# Patient Record
Sex: Female | Born: 1990 | Race: Black or African American | Hispanic: No | Marital: Single | Smoking: Former smoker
Health system: Southern US, Community
[De-identification: ages and names within clinical notes are randomized; demographics above are authoritative.]

---

## 2014-07-06 ENCOUNTER — Encounter (HOSPITAL_COMMUNITY): Payer: Self-pay | Admitting: Emergency Medicine

## 2014-07-06 ENCOUNTER — Inpatient Hospital Stay (HOSPITAL_COMMUNITY): Payer: 59

## 2014-07-06 ENCOUNTER — Emergency Department (INDEPENDENT_AMBULATORY_CARE_PROVIDER_SITE_OTHER)
Admission: EM | Admit: 2014-07-06 | Discharge: 2014-07-06 | Disposition: A | Discharge status: Home / Self Care | Payer: 59 | Source: Home / Self Care | Attending: Emergency Medicine | Admitting: Emergency Medicine

## 2014-07-06 ENCOUNTER — Inpatient Hospital Stay (HOSPITAL_COMMUNITY)
Admission: AD | Admit: 2014-07-06 | Discharge: 2014-07-06 | Disposition: A | Discharge status: Home / Self Care | Payer: 59 | Source: Ambulatory Visit | Attending: Obstetrics & Gynecology | Admitting: Obstetrics & Gynecology

## 2014-07-06 ENCOUNTER — Other Ambulatory Visit (HOSPITAL_COMMUNITY)
Admission: RE | Admit: 2014-07-06 | Discharge: 2014-07-06 | Disposition: A | Discharge status: Home / Self Care | Payer: 59 | Source: Ambulatory Visit | Attending: Emergency Medicine | Admitting: Emergency Medicine

## 2014-07-06 ENCOUNTER — Encounter (HOSPITAL_COMMUNITY): Payer: Self-pay | Admitting: *Deleted

## 2014-07-06 DIAGNOSIS — R102 Pelvic and perineal pain: Secondary | ICD-10-CM | POA: Insufficient documentation

## 2014-07-06 DIAGNOSIS — N760 Acute vaginitis: Secondary | ICD-10-CM | POA: Insufficient documentation

## 2014-07-06 DIAGNOSIS — Z113 Encounter for screening for infections with a predominantly sexual mode of transmission: Secondary | ICD-10-CM | POA: Diagnosis not present

## 2014-07-06 DIAGNOSIS — N83209 Unspecified ovarian cyst, unspecified side: Secondary | ICD-10-CM

## 2014-07-06 DIAGNOSIS — N8320 Unspecified ovarian cysts: Secondary | ICD-10-CM | POA: Insufficient documentation

## 2014-07-06 LAB — CERVICOVAGINAL ANCILLARY ONLY
WET PREP (BD AFFIRM): POSITIVE — AB
Wet Prep (BD Affirm): NEGATIVE
Wet Prep (BD Affirm): NEGATIVE

## 2014-07-06 LAB — CBC WITH DIFFERENTIAL/PLATELET
Basophils Absolute: 0 10*3/uL (ref 0.0–0.1)
Basophils Relative: 0 % (ref 0–1)
EOS ABS: 0 10*3/uL (ref 0.0–0.7)
Eosinophils Relative: 1 % (ref 0–5)
HCT: 37.6 % (ref 36.0–46.0)
HEMOGLOBIN: 12.1 g/dL (ref 12.0–15.0)
LYMPHS PCT: 29 % (ref 12–46)
Lymphs Abs: 1.8 10*3/uL (ref 0.7–4.0)
MCH: 24.7 pg — AB (ref 26.0–34.0)
MCHC: 32.2 g/dL (ref 30.0–36.0)
MCV: 76.7 fL — AB (ref 78.0–100.0)
MONOS PCT: 6 % (ref 3–12)
Monocytes Absolute: 0.4 10*3/uL (ref 0.1–1.0)
Neutro Abs: 4 10*3/uL (ref 1.7–7.7)
Neutrophils Relative %: 64 % (ref 43–77)
Platelets: 329 10*3/uL (ref 150–400)
RBC: 4.9 MIL/uL (ref 3.87–5.11)
RDW: 15.6 % — ABNORMAL HIGH (ref 11.5–15.5)
WBC: 6.3 10*3/uL (ref 4.0–10.5)

## 2014-07-06 LAB — POCT URINALYSIS DIP (DEVICE)
BILIRUBIN URINE: NEGATIVE
GLUCOSE, UA: NEGATIVE mg/dL
Ketones, ur: NEGATIVE mg/dL
Leukocytes, UA: NEGATIVE
Nitrite: NEGATIVE
PH: 5.5 (ref 5.0–8.0)
Protein, ur: NEGATIVE mg/dL
Specific Gravity, Urine: 1.025 (ref 1.005–1.030)
Urobilinogen, UA: 0.2 mg/dL (ref 0.0–1.0)

## 2014-07-06 LAB — POCT I-STAT, CHEM 8
BUN: 15 mg/dL (ref 6–23)
CALCIUM ION: 1.22 mmol/L (ref 1.12–1.23)
Chloride: 104 mEq/L (ref 96–112)
Creatinine, Ser: 0.8 mg/dL (ref 0.50–1.10)
Glucose, Bld: 99 mg/dL (ref 70–99)
HEMATOCRIT: 39 % (ref 36.0–46.0)
Hemoglobin: 13.3 g/dL (ref 12.0–15.0)
POTASSIUM: 4.3 meq/L (ref 3.7–5.3)
Sodium: 137 mEq/L (ref 137–147)
TCO2: 23 mmol/L (ref 0–100)

## 2014-07-06 LAB — POCT PREGNANCY, URINE: Preg Test, Ur: NEGATIVE

## 2014-07-06 MED ORDER — ONDANSETRON 4 MG PO TBDP
8.0000 mg | ORAL_TABLET | Freq: Once | ORAL | Status: AC
  Administered 2014-07-06: 8 mg via ORAL

## 2014-07-06 MED ORDER — HYDROMORPHONE HCL 1 MG/ML IJ SOLN
1.0000 mg | Freq: Once | INTRAMUSCULAR | Status: AC
  Administered 2014-07-06: 1 mg via INTRAMUSCULAR

## 2014-07-06 MED ORDER — OXYCODONE-ACETAMINOPHEN 5-325 MG PO TABS: 1.0000 | ORAL_TABLET | ORAL | Status: DC | PRN

## 2014-07-06 MED ORDER — ONDANSETRON 4 MG PO TBDP
ORAL_TABLET | ORAL | Status: AC
  Filled 2014-07-06: qty 2

## 2014-07-06 MED ORDER — HYDROMORPHONE HCL 1 MG/ML IJ SOLN
INTRAMUSCULAR | Status: AC
  Filled 2014-07-06: qty 1

## 2014-07-06 MED ORDER — IBUPROFEN 600 MG PO TABS: 600.0000 mg | ORAL_TABLET | Freq: Four times a day (QID) | ORAL | Status: DC | PRN

## 2014-07-06 NOTE — MAU Note (Signed)
Patient states she started having pain that starts from the umbilicus around to the anus. Was seen at Urgent Care and sent to MAU. Has nausea, no vomiting since the medications she was given there. Denies vaginal bleeding or discharge.

## 2014-07-06 NOTE — MAU Provider Note (Signed)
CC: Abdominal Pain   Initial contact after pt returned from [place] @1730     HPI Virginia Howard is a 23 y.o.  Nulligravida who is sent here  from Urgent Care where she was seen today for [place] to r/o ovarian torsion. Note not available but she was evaluated by Dr. Lorenz Coaster and had pelvic and WP, GC/CT cultures sent.  She has had severe lower abdominal pain since yesterday morning. The pain is crampy and feels like spasms, waxes and wanes, worse in morning. Pain resolved after Dilaudid given in Urgent Care but reluctant to take narcotics due to nausea.  Denies any irritative or abnormal vaginal discharge. No dysparunia. LMP 06/15/14. Denies abnormal bleeding. No contraception. She has been in mutually monogamous relationship for 8 years. Needs Gynecologist.   History reviewed. No pertinent past medical history.  OB History  No data available    History reviewed. No pertinent past surgical history.  History   Social History  . Marital Status: Single    Spouse Name: N/A    Number of Children: N/A  . Years of Education: N/A   Occupational History  . Not on file.   Social History Main Topics  . Smoking status: Current Every Day Smoker  . Smokeless tobacco: Never Used  . Alcohol Use: Yes     Comment: socially  . Drug Use: No  . Sexual Activity: Yes    Birth Control/ Protection: None   Other Topics Concern  . Not on file   Social History Narrative  . No narrative on file    No current facility-administered medications on file prior to encounter.   No current outpatient prescriptions on file prior to encounter.    No Known Allergies  ROS Pertinent items in HPI  PHYSICAL EXAM Filed Vitals:   07/06/14 1519  BP: 136/64  Pulse: 60  Temp: 98.2 F (36.8 C)  Resp: 18   General: Obese female in no acute distress (Had Dilaudid 2 hr PTA) Cardiovascular: Normal rate Respiratory: Normal effort Abdomen: Soft, obese, nontender throughtout Back: No CVAT Extremities: No  edema Neurologic: Alert and oriented    LAB RESULTS Results for orders placed during the hospital encounter of 07/06/14 (from the past 24 hour(s))  POCT URINALYSIS DIP (DEVICE)     Status: Abnormal   Collection Time    07/06/14 12:59 PM      Result Value Ref Range   Glucose, UA NEGATIVE  NEGATIVE mg/dL   Bilirubin Urine NEGATIVE  NEGATIVE   Ketones, ur NEGATIVE  NEGATIVE mg/dL   Specific Gravity, Urine 1.025  1.005 - 1.030   Hgb urine dipstick SMALL (*) NEGATIVE   pH 5.5  5.0 - 8.0   Protein, ur NEGATIVE  NEGATIVE mg/dL   Urobilinogen, UA 0.2  0.0 - 1.0 mg/dL   Nitrite NEGATIVE  NEGATIVE   Leukocytes, UA NEGATIVE  NEGATIVE  POCT PREGNANCY, URINE     Status: None   Collection Time    07/06/14  1:01 PM      Result Value Ref Range   Preg Test, Ur NEGATIVE  NEGATIVE  CBC WITH DIFFERENTIAL     Status: Abnormal   Collection Time    07/06/14  1:04 PM      Result Value Ref Range   WBC 6.3  4.0 - 10.5 K/uL   RBC 4.90  3.87 - 5.11 MIL/uL   Hemoglobin 12.1  12.0 - 15.0 g/dL   HCT 81.1  91.4 - 78.2 %   MCV 76.7 (*)  78.0 - 100.0 fL   MCH 24.7 (*) 26.0 - 34.0 pg   MCHC 32.2  30.0 - 36.0 g/dL   RDW 14.715.6 (*) 82.911.5 - 56.215.5 %   Platelets 329  150 - 400 K/uL   Neutrophils Relative % 64  43 - 77 %   Neutro Abs 4.0  1.7 - 7.7 K/uL   Lymphocytes Relative 29  12 - 46 %   Lymphs Abs 1.8  0.7 - 4.0 K/uL   Monocytes Relative 6  3 - 12 %   Monocytes Absolute 0.4  0.1 - 1.0 K/uL   Eosinophils Relative 1  0 - 5 %   Eosinophils Absolute 0.0  0.0 - 0.7 K/uL   Basophils Relative 0  0 - 1 %   Basophils Absolute 0.0  0.0 - 0.1 K/uL  POCT I-STAT, CHEM 8     Status: None   Collection Time    07/06/14  1:59 PM      Result Value Ref Range   Sodium 137  137 - 147 mEq/L   Potassium 4.3  3.7 - 5.3 mEq/L   Chloride 104  96 - 112 mEq/L   BUN 15  6 - 23 mg/dL   Creatinine, Ser 1.300.80  0.50 - 1.10 mg/dL   Glucose, Bld 99  70 - 99 mg/dL   Calcium, Ion 8.651.22  7.841.12 - 1.23 mmol/L   TCO2 23  0 - 100 mmol/L    Hemoglobin 13.3  12.0 - 15.0 g/dL   HCT 69.639.0  29.536.0 - 28.446.0 %    IMAGING [place] Transvaginal Non-ob  07/06/2014   CLINICAL DATA:  Acute pelvic pain x2 days  EXAM: TRANSABDOMINAL AND TRANSVAGINAL ULTRASOUND OF PELVIS  DOPPLER ULTRASOUND OF OVARIES  TECHNIQUE: Both transabdominal and transvaginal ultrasound examinations of the pelvis were performed. Transabdominal technique was performed for global imaging of the pelvis including uterus, ovaries, adnexal regions, and pelvic cul-de-sac.  It was necessary to proceed with endovaginal exam following the transabdominal exam to visualize the endometrium. Color and duplex Doppler ultrasound was utilized to evaluate blood flow to the ovaries.  COMPARISON:  None.  FINDINGS: Uterus  Measurements: 7.8 x 3.8 x 4.4 cm. No fibroids or other mass visualized.  Endometrium  Thickness: 10 mm.  No focal abnormality visualized.  Right ovary  Measurements: 6.0 x 3.8 x 4.4 cm. 4.4 x 3.4 x 3.4 cm complex/ hemorrhagic cyst.  Left ovary  Measurements: 2.6 x 2.0 x 2.2 cm. Normal appearance/no adnexal mass.  Pulsed Doppler evaluation of both ovaries demonstrates normal low-resistance arterial and venous waveforms.  Other findings  Moderate complex fluid/ hemorrhage in the pelvic cul-de-sac.  IMPRESSION: 4.4 cm right ovarian hemorrhagic cyst.  Moderate complex fluid/hemorrhage in the pelvic cul-de-sac. Assuming a negative pregnancy test, this is likely related to hemorrhagic cyst rupture.  No evidence of ovarian torsion.   Electronically Signed   By: Charline BillsSriyesh  Krishnan M.D.   On: 07/06/2014 17:28   [place] Pelvis Complete  07/06/2014   CLINICAL DATA:  Acute pelvic pain x2 days  EXAM: TRANSABDOMINAL AND TRANSVAGINAL ULTRASOUND OF PELVIS  DOPPLER ULTRASOUND OF OVARIES  TECHNIQUE: Both transabdominal and transvaginal ultrasound examinations of the pelvis were performed. Transabdominal technique was performed for global imaging of the pelvis including uterus, ovaries, adnexal regions, and pelvic  cul-de-sac.  It was necessary to proceed with endovaginal exam following the transabdominal exam to visualize the endometrium. Color and duplex Doppler ultrasound was utilized to evaluate blood flow to the ovaries.  COMPARISON:  None.  FINDINGS: Uterus  Measurements: 7.8 x 3.8 x 4.4 cm. No fibroids or other mass visualized.  Endometrium  Thickness: 10 mm.  No focal abnormality visualized.  Right ovary  Measurements: 6.0 x 3.8 x 4.4 cm. 4.4 x 3.4 x 3.4 cm complex/ hemorrhagic cyst.  Left ovary  Measurements: 2.6 x 2.0 x 2.2 cm. Normal appearance/no adnexal mass.  Pulsed Doppler evaluation of both ovaries demonstrates normal low-resistance arterial and venous waveforms.  Other findings  Moderate complex fluid/ hemorrhage in the pelvic cul-de-sac.  IMPRESSION: 4.4 cm right ovarian hemorrhagic cyst.  Moderate complex fluid/hemorrhage in the pelvic cul-de-sac. Assuming a negative pregnancy test, this is likely related to hemorrhagic cyst rupture.  No evidence of ovarian torsion.   Electronically Signed   By: Charline BillsSriyesh  Krishnan M.D.   On: 07/06/2014 17:28   [place] Art/ven Flow Abd Pelv Doppler  07/06/2014   CLINICAL DATA:  Acute pelvic pain x2 days  EXAM: TRANSABDOMINAL AND TRANSVAGINAL ULTRASOUND OF PELVIS  DOPPLER ULTRASOUND OF OVARIES  TECHNIQUE: Both transabdominal and transvaginal ultrasound examinations of the pelvis were performed. Transabdominal technique was performed for global imaging of the pelvis including uterus, ovaries, adnexal regions, and pelvic cul-de-sac.  It was necessary to proceed with endovaginal exam following the transabdominal exam to visualize the endometrium. Color and duplex Doppler ultrasound was utilized to evaluate blood flow to the ovaries.  COMPARISON:  None.  FINDINGS: Uterus  Measurements: 7.8 x 3.8 x 4.4 cm. No fibroids or other mass visualized.  Endometrium  Thickness: 10 mm.  No focal abnormality visualized.  Right ovary  Measurements: 6.0 x 3.8 x 4.4 cm. 4.4 x 3.4 x 3.4 cm complex/  hemorrhagic cyst.  Left ovary  Measurements: 2.6 x 2.0 x 2.2 cm. Normal appearance/no adnexal mass.  Pulsed Doppler evaluation of both ovaries demonstrates normal low-resistance arterial and venous waveforms.  Other findings  Moderate complex fluid/ hemorrhage in the pelvic cul-de-sac.  IMPRESSION: 4.4 cm right ovarian hemorrhagic cyst.  Moderate complex fluid/hemorrhage in the pelvic cul-de-sac. Assuming a negative pregnancy test, this is likely related to hemorrhagic cyst rupture.  No evidence of ovarian torsion.   Electronically Signed   By: Charline BillsSriyesh  Krishnan M.D.   On: 07/06/2014 17:28    MAU COURSE   ASSESSMENT  1. Acute pelvic pain, female   2. Hemorrhagic cyst of ovary     PLAN Discharge home. See AVS for patient education.    Medication List    STOP taking these medications       MUCINEX FAST-MAX COLD FLU 5-10-200-325 MG Tabs  Generic drug:  Phenylephrine-DM-GG-APAP      TAKE these medications       ibuprofen 600 MG tablet  Commonly known as:  ADVIL,MOTRIN  Take 1 tablet (600 mg total) by mouth every 6 (six) hours as needed.     MULTIVITAMIN PO  Take 1 tablet by mouth daily.     oxyCODONE-acetaminophen 5-325 MG per tablet  Commonly known as:  PERCOCET/ROXICET  Take 1 tablet by mouth every 4 (four) hours as needed.      Discussed alternating Acetaminophen (up to 4 Gm/24 hr)  and Motrin and Percocet for breakthrough pain.  Follow-up Information   Follow up with [facility]. (Someone from Clinic will call you with appt.)    Contact information:   [address] [place] [address] [phone]       Danae OrleansDeirdre C Soumya Colson, CNM 07/06/2014 4:51 PM

## 2014-07-06 NOTE — ED Provider Notes (Signed)
Chief Complaint   Abdominal Pain   History of Present Illness   Serena ColonelJasmine Sargent is a 23 year old female who experienced sudden onset of pain yesterday morning at 11 AM. This occurred shortly after she woke up and she did not have any other precipitating causes. Specifically was not brought on by eating. The pain was localized around the umbilicus and radiates through to the anus. She had 2 hours of very severe pain rated 12/10 in intensity, then it diminished to a 3-4/10 in intensity after a meal. The pain persisted throughout the remainder the day. The same thing happened this morning after she woke up with severe pain for several hours, diminishing to a lesser pain throughout the rest of the day. She noted muscle spasms, cramps, vaginal pain, and pelvic pain. She denies any fever or chills. When the pain was severe she felt hot and sweaty. She denies any nausea or vomiting. Her appetite was good and. Her bowel movements have been regular, although this morning she felt constipated. She has not had a bowel movement today. She denies any hematochezia or melena. No urinary symptoms. No GYN complaints, vaginal discharge, or itching. Last menstrual period was October 3. She is sexually active without use of birth control. She does not think she is pregnant. She smokes one fourth pack of cigarettes per day. She's on a special very low-calorie [employer] diet. She denies any history of ulcer disease, gastritis, gallbladder or liver problems, pancreatitis, diverticulitis, appendicitis, or GYN problems such as fibroid tumors or ovarian cysts. She's had no double surgeries in the past.  Review of Systems   Other than as noted above, the patient denies any of the following symptoms: Constitutional:  No fever, chills, weight loss or anorexia. Abdomen:  No nausea, vomiting, hematememesis, melena, diarrhea, or hematochezia. GU:  No dysuria, frequency, urgency, or hematuria. Gyn:  No vaginal discharge, itching,  abnormal bleeding, dyspareunia, or pelvic pain.  PMFSH   Past medical history, family history, social history, meds, and allergies were reviewed.   Physical Exam     Vital signs:  BP 127/73  Pulse 66  Temp(Src) 98.9 F (37.2 C) (Oral)  Resp 18  SpO2 99%  LMP 06/15/2014 Gen:  Alert, oriented, in no distress. Lungs:  Breath sounds clear and equal bilaterally.  No wheezes, rales or rhonchi. Heart:  Regular rhythm.  No gallops or murmers.   Abdomen:  Abdomen is soft, flat, nondistended. No organomegaly or mass. Bowel sounds are normally active. There is pain to palpation around the umbilicus and the lower abdomen bilaterally without guarding or rebound. Pelvic:  Normal external genitalia, vaginal and cervical mucosa were normal. There was pain on cervical motion. Uterus was normal in size and shape but was tender to palpation. Adnexa are moderately tender to palpation with no masses.  DNA probes for gonorrhea, Chlamydia, Trichomonas, Gardnerella, and Candida were obtained. Skin:  Clear, warm and dry.  No rash.  Labs   Results for orders placed during the hospital encounter of 07/06/14  CBC WITH DIFFERENTIAL      Result Value Ref Range   WBC 6.3  4.0 - 10.5 K/uL   RBC 4.90  3.87 - 5.11 MIL/uL   Hemoglobin 12.1  12.0 - 15.0 g/dL   HCT 40.937.6  81.136.0 - 91.446.0 %   MCV 76.7 (*) 78.0 - 100.0 fL   MCH 24.7 (*) 26.0 - 34.0 pg   MCHC 32.2  30.0 - 36.0 g/dL   RDW 78.215.6 (*) 95.611.5 - 21.315.5 %  Platelets 329  150 - 400 K/uL   Neutrophils Relative % 64  43 - 77 %   Neutro Abs 4.0  1.7 - 7.7 K/uL   Lymphocytes Relative 29  12 - 46 %   Lymphs Abs 1.8  0.7 - 4.0 K/uL   Monocytes Relative 6  3 - 12 %   Monocytes Absolute 0.4  0.1 - 1.0 K/uL   Eosinophils Relative 1  0 - 5 %   Eosinophils Absolute 0.0  0.0 - 0.7 K/uL   Basophils Relative 0  0 - 1 %   Basophils Absolute 0.0  0.0 - 0.1 K/uL  POCT URINALYSIS DIP (DEVICE)      Result Value Ref Range   Glucose, UA NEGATIVE  NEGATIVE mg/dL   Bilirubin  Urine NEGATIVE  NEGATIVE   Ketones, ur NEGATIVE  NEGATIVE mg/dL   Specific Gravity, Urine 1.025  1.005 - 1.030   Hgb urine dipstick SMALL (*) NEGATIVE   pH 5.5  5.0 - 8.0   Protein, ur NEGATIVE  NEGATIVE mg/dL   Urobilinogen, UA 0.2  0.0 - 1.0 mg/dL   Nitrite NEGATIVE  NEGATIVE   Leukocytes, UA NEGATIVE  NEGATIVE  POCT PREGNANCY, URINE      Result Value Ref Range   Preg Test, Ur NEGATIVE  NEGATIVE  POCT I-STAT, CHEM 8      Result Value Ref Range   Sodium 137  137 - 147 mEq/L   Potassium 4.3  3.7 - 5.3 mEq/L   Chloride 104  96 - 112 mEq/L   BUN 15  6 - 23 mg/dL   Creatinine, Ser 8.180.80  0.50 - 1.10 mg/dL   Glucose, Bld 99  70 - 99 mg/dL   Calcium, Ion 2.991.22  3.711.12 - 1.23 mmol/L   TCO2 23  0 - 100 mmol/L   Hemoglobin 13.3  12.0 - 15.0 g/dL   HCT 69.639.0  78.936.0 - 38.146.0 %    Course in Urgent Care Center   The following medications were given:  Medications  HYDROmorphone (DILAUDID) injection 1 mg (1 mg Intramuscular Given 07/06/14 1337)  ondansetron (ZOFRAN-ODT) disintegrating tablet 8 mg (8 mg Oral Given 07/06/14 1340)    She got relief of pain after the above medicines.  Assessment   The encounter diagnosis was Pelvic pain in female.  With the normal white blood cell,, this makes appendicitis or diverticulitis less likely. My concerns right now or for PID versus ovarian cyst or torsion of ovary. I believe she needs further evaluation including pelvic ultrasound. Therefore [facility]. She was told not to eat or drink on her way over there. Report was called to the provider on call at the MAU.  Plan     The patient was transferred to the ED via private vehicle in stable condition. Her husband will drive her there. She was told not to eat or drink anything on her way over.  Medical Decision Making:  A 23 year old female has had a two-day history of severe, bilateral pelvic pain. She denies any associated fever, nausea, vomiting, diarrhea, or  urinary, or GYN symptoms. Her last menstrual period was October 3. On exam she is tender over her entire pelvic area, and on pelvic exam she has positive cervical motion tenderness, uterine tenderness, and bilateral adnexal tenderness. My concern is for ovarian torsion, and I feel she needs an ultrasound.      Reuben Likesavid C Audry Kauzlarich, MD 07/06/14 [phone]

## 2014-07-06 NOTE — ED Notes (Signed)
Patient c/o severe abdominal pain onset yesterday. Patient denies urinary symptoms, discharge, abnormal bleeding or constipation. 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 Denies abdominal cramping. Patient is in NAD.

## 2014-07-06 NOTE — Discharge Instructions (Signed)
Ovarian Cyst An ovarian cyst is a fluid-filled sac that forms on an ovary. The ovaries are small organs that produce eggs in women. Various types of cysts can form on the ovaries. Most are not cancerous. Many do not cause problems, and they often go away on their own. Some may cause symptoms and require treatment. Common types of ovarian cysts include:  Functional cysts--These cysts may occur every month during the menstrual cycle. This is normal. The cysts usually go away with the next menstrual cycle if the woman does not get pregnant. Usually, there are no symptoms with a functional cyst.  Endometrioma cysts--These cysts form from the tissue that lines the uterus. They are also called "chocolate cysts" because they become filled with blood that turns brown. This type of cyst can cause pain in the lower abdomen during intercourse and with your menstrual period.  Cystadenoma cysts--This type develops from the cells on the outside of the ovary. These cysts can get very big and cause lower abdomen pain and pain with intercourse. This type of cyst can twist on itself, cut off its blood supply, and cause severe pain. It can also easily rupture and cause a lot of pain.  Dermoid cysts--This type of cyst is sometimes found in both ovaries. These cysts may contain different kinds of body tissue, such as skin, teeth, hair, or cartilage. They usually do not cause symptoms unless they get very big.  Theca lutein cysts--These cysts occur when too much of a certain hormone (human chorionic gonadotropin) is produced and overstimulates the ovaries to produce an egg. This is most common after procedures used to assist with the conception of a baby (in vitro fertilization). CAUSES   Fertility drugs can cause a condition in which multiple large cysts are formed on the ovaries. This is called ovarian hyperstimulation syndrome.  A condition called polycystic ovary syndrome can cause hormonal imbalances that can lead to  nonfunctional ovarian cysts. SIGNS AND SYMPTOMS  Many ovarian cysts do not cause symptoms. If symptoms are present, they may include:  Pelvic pain or pressure.  Pain in the lower abdomen.  Pain during sexual intercourse.  Increasing girth (swelling) of the abdomen.  Abnormal menstrual periods.  Increasing pain with menstrual periods.  Stopping having menstrual periods without being pregnant. DIAGNOSIS  These cysts are commonly found during a routine or annual pelvic exam. Tests may be ordered to find out more about the cyst. These tests may include:  Ultrasound.  X-ray of the pelvis.  CT scan.  MRI.  Blood tests. TREATMENT  Many ovarian cysts go away on their own without treatment. Your health care provider may want to check your cyst regularly for 2-3 months to see if it changes. For women in menopause, it is particularly important to monitor a cyst closely because of the higher rate of ovarian cancer in menopausal women. When treatment is needed, it may include any of the following:  A procedure to drain the cyst (aspiration). This may be done using a long needle and ultrasound. It can also be done through a laparoscopic procedure. This involves using a thin, lighted tube with a tiny camera on the end (laparoscope) inserted through a small incision.  Surgery to remove the whole cyst. This may be done using laparoscopic surgery or an open surgery involving a larger incision in the lower abdomen.  Hormone treatment or birth control pills. These methods are sometimes used to help dissolve a cyst. HOME CARE INSTRUCTIONS   Only take over-the-counter   or prescription medicines as directed by your health care provider.  Follow up with your health care provider as directed.  Get regular pelvic exams and Pap tests. SEEK MEDICAL CARE IF:   Your periods are late, irregular, or painful, or they stop.  Your pelvic pain or abdominal pain does not go away.  Your abdomen becomes  larger or swollen.  You have pressure on your bladder or trouble emptying your bladder completely.  You have pain during sexual intercourse.  You have feelings of fullness, pressure, or discomfort in your stomach.  You lose weight for no apparent reason.  You feel generally ill.  You become constipated.  You lose your appetite.  You develop acne.  You have an increase in body and facial hair.  You are gaining weight, without changing your exercise and eating habits.  You think you are pregnant. SEEK IMMEDIATE MEDICAL CARE IF:   You have increasing abdominal pain.  You feel sick to your stomach (nauseous), and you throw up (vomit).  You develop a fever that comes on suddenly.  You have abdominal pain during a bowel movement.  Your menstrual periods become heavier than usual. MAKE SURE YOU:  Understand these instructions.  Will watch your condition.  Will get help right away if you are not doing well or get worse. Document Released: 09/01/2005 Document Revised: 09/06/2013 Document Reviewed: 05/09/2013 [employer] Patient Information 2015 [employer]. This information is not intended to replace advice given to you by your health care provider. Make sure you discuss any questions you have with your health care provider.  

## 2014-07-06 NOTE — Discharge Instructions (Signed)
Ovarian Torsion The ovaries are female reproductive organs that produce eggs. Ovarian torsion is when an ovary becomes twisted and cuts off its own blood supply. This can occur at any age. If an ovary is twisted, it cannot get blood and the ovary swells. It is a painful medical emergency. It must be treated quickly. If too much time has passed, blood flow to the ovary may not be restored and the ovary may have to be removed. CAUSES Torsion can happen in an ovary that is normal size. However, most of the time it occurs in an ovary that is enlarged. An ovary can become enlarged because of:  Harmless (benign) tumors on the ovaries.  Cancerous tumors.  Ovarian cysts, which are fluid-filled sacs.  Normal pregnancy.  A pregnancy that occurs outside the uterus (ectopic pregnancy). RISK FACTORS Risk factors are things that increase the likelihood of this condition happening. The risk factors include:  Having fallopian tubes that are longer than normal.  Having ovaries that are larger than normal.  Taking fertility medicine to become pregnant.  Having had surgery in the pelvic area. SYMPTOMS  Sudden pain in the lower abdomen, usually on one side only.  Pelvic pain that starts after exercise.  Pelvic pain that gets worse over time.  Severe pelvic pain that comes and goes.  Pelvic pain that spreads into the lower back or thigh.  Nausea and vomiting along with pelvic pain. DIAGNOSIS Your caregiver will take a history and perform a physical exam. He or she may be able to feel an enlarged ovary. Your caregiver may order some further tests, which include:  A pregnancy test.  Imaging tests, such as pelvic Doppler ultrasonography, that measures blood flow, CT scan, or MRI. Your caregiver may also perform a diagnostic laparoscopic exam. A small surgical cut (incision) will be made in your abdomen. Then, a small lighted telescope is put through the opening. This allows your caregiver to  clearly see your ovary and fallopian tube.  TREATMENT Surgery is needed when an ovary becomes twisted. It is best to do this 8 hours or less after the ovary becomes twisted. Laparoscopic ovarian torsion surgery is done to try to untwist the ovary. Sometimes, a large incision has to be made in the abdomen (laparotomy) to relieve the ovary. If the ovary cannot be untwisted, the ovary will have to be surgically removed during a procedure called salpingo-oophorectomy. Document Released: 08/21/2011 Document Revised: 01/16/2014 Document Reviewed: 08/21/2011 [facility] Patient Information 2015 [place], [place]. This information is not intended to replace advice given to you by your health care provider. Make sure you discuss any questions you have with your health care provider.  .Pelvic Inflammatory Disease Pelvic inflammatory disease (PID) refers to an infection in some or all of the female organs. The infection can be in the uterus, ovaries, fallopian tubes, or the surrounding tissues in the pelvis. PID can cause abdominal or pelvic pain that comes on suddenly (acute pelvic pain). PID is a serious infection because it can lead to lasting (chronic) pelvic pain or the inability to have children (infertile).  CAUSES  The infection is often caused by the normal bacteria found in the vaginal tissues. PID may also be caused by an infection that is spread during sexual contact. PID can also occur following:   The birth of a baby.   A miscarriage.   An abortion.   Major pelvic surgery.   The use of an intrauterine device (IUD).   A sexual assault.  RISK  FACTORS Certain factors can put a person at higher risk for PID, such as:  Being younger than 25 years.  Being sexually active at [place] age.  Usingnonbarrier contraception.  Havingmultiple sexual partners.  Having sex with someone who has symptoms of a genital infection.  Using oral contraception. Other times, certain behaviors can  increase the possibility of getting PID, such as:  Having sex during your period.  Using a vaginal douche.  Having an intrauterine device (IUD) in place. SYMPTOMS   Abdominal or pelvic pain.   Fever.   Chills.   Abnormal vaginal discharge.  Abnormal uterine bleeding.   Unusual pain shortly after finishing your period. DIAGNOSIS  Your caregiver will choose some of the following methods to make a diagnosis, such as:   Performinga physical exam and history. A pelvic exam typically reveals a very tender uterus and surrounding pelvis.   Ordering laboratory tests including a pregnancy test, blood tests, and urine test.  Orderingcultures of the vagina and cervix to check for a sexually transmitted infection (STI).  Performing an ultrasound.   Performing a laparoscopic procedure to look inside the pelvis.  TREATMENT   Antibiotic medicines may be prescribed and taken by mouth.   Sexual partners may be treated when the infection is caused by a sexually transmitted disease (STD).   Hospitalization may be needed to give antibiotics intravenously.  Surgery may be needed, but this is rare. It may take weeks until you are completely well. If you are diagnosed with PID, you should also be checked for human immunodeficiency virus (HIV). HOME CARE INSTRUCTIONS   If given, take your antibiotics as directed. Finish the medicine even if you start to feel better.   Only take over-the-counter or prescription medicines for pain, discomfort, or fever as directed by your caregiver.   Do not have sexual intercourse until treatment is completed or as directed by your caregiver. If PID is confirmed, your recent sexual partner(s) will need treatment.   Keep your follow-up appointments. SEEK MEDICAL CARE IF:   You have increased or abnormal vaginal discharge.   You need prescription medicine for your pain.   You vomit.   You cannot take your medicines.   Your  partner has an STD.  SEEK IMMEDIATE MEDICAL CARE IF:   You have a fever.   You have increased abdominal or pelvic pain.   You have chills.   You have pain when you urinate.   You are not better after 72 hours following treatment.  MAKE SURE YOU:   Understand these instructions.  Will watch your condition.  Will get help right away if you are not doing well or get worse. Document Released: 09/01/2005 Document Revised: 12/27/2012 Document Reviewed: 08/28/2011 [facility] Patient Information 2015 [place], [place]. This information is not intended to replace advice given to you by your health care provider. Make sure you discuss any questions you have with your health care provider.

## 2014-07-07 ENCOUNTER — Encounter: Payer: Self-pay | Admitting: Obstetrics & Gynecology

## 2014-07-07 LAB — CERVICOVAGINAL ANCILLARY ONLY
Chlamydia: NEGATIVE
Neisseria Gonorrhea: NEGATIVE

## 2014-07-10 NOTE — MAU Provider Note (Signed)
Attestation of Attending Supervision of Advanced Practitioner (CNM/NP): Evaluation and management procedures were performed by the Advanced Practitioner under my supervision and collaboration.  I have reviewed the Advanced Practitioner's note and chart, and I agree with the management and plan.  Ladrea Holladay 07/10/2014 1:15 PM

## 2014-07-11 ENCOUNTER — Telehealth (HOSPITAL_COMMUNITY): Payer: Self-pay | Admitting: Emergency Medicine

## 2014-07-11 MED ORDER — METRONIDAZOLE 500 MG PO TABS: 500.0000 mg | ORAL_TABLET | Freq: Two times a day (BID) | ORAL | Status: DC

## 2014-07-11 NOTE — ED Notes (Signed)
GC/Chlamydia neg., Affirm: Candida and Trich neg., Gardnerella pos.  Message to Dr. Lorenz CoasterKeller. Virginia Howard, Virginia Howard 07/11/2014

## 2014-07-11 NOTE — ED Notes (Signed)
The patient's DNA probe came back positive for Gardnerella. She will need metronidazole 500 mg, #14, 1 twice a day for one week. This will be sent to her pharmacy. We will need to call her and let her know these results.   Reuben Likesavid C Musa Rewerts, MD 07/11/14 [phone]

## 2014-07-12 ENCOUNTER — Telehealth (HOSPITAL_COMMUNITY): Payer: Self-pay | Admitting: *Deleted

## 2014-07-12 NOTE — ED Notes (Signed)
Dr. Lorenz CoasterKeller e-prescribed Flagyl to pt.'s pharmacy on 10/27.  I called pt. and left a message to call.  Call 1.  Pt. called back a few minutes later.  Pt. verified x 2 and given results. Pt. told she needs Flagyl for bacterial vaginosis and where to pick up her Rx.   Pt. instructed to no alcohol while taking this medication.  Pt. voiced understanding. Vassie MoselleYork, Annaka Cleaver M 07/12/2014

## 2014-08-23 ENCOUNTER — Encounter: Payer: 59 | Admitting: Obstetrics & Gynecology

## 2014-10-06 ENCOUNTER — Encounter: Payer: 59 | Admitting: Obstetrics & Gynecology

## 2014-10-12 ENCOUNTER — Encounter: Payer: Self-pay | Admitting: Obstetrics & Gynecology

## 2016-02-06 IMAGING — US US TRANSVAGINAL NON-OB
1 series · 13 of 25 positions shown · non-contrast
Comparison: None.

CLINICAL DATA: Acute pelvic pain x2 days

EXAM:
TRANSABDOMINAL AND TRANSVAGINAL ULTRASOUND OF PELVIS
DOPPLER ULTRASOUND OF OVARIES
TECHNIQUE: Both transabdominal and transvaginal ultrasound examinations of the
pelvis were performed. Transabdominal technique was performed for
global imaging of the pelvis including uterus, ovaries, adnexal
regions, and pelvic cul-de-sac.
It was necessary to proceed with endovaginal exam following the
transabdominal exam to visualize the endometrium. Color and duplex
Doppler ultrasound was utilized to evaluate blood flow to the
ovaries.

[Series 1: us pelvis complete · 66 acquisitions, 13 frames shown]
[im 1/66]
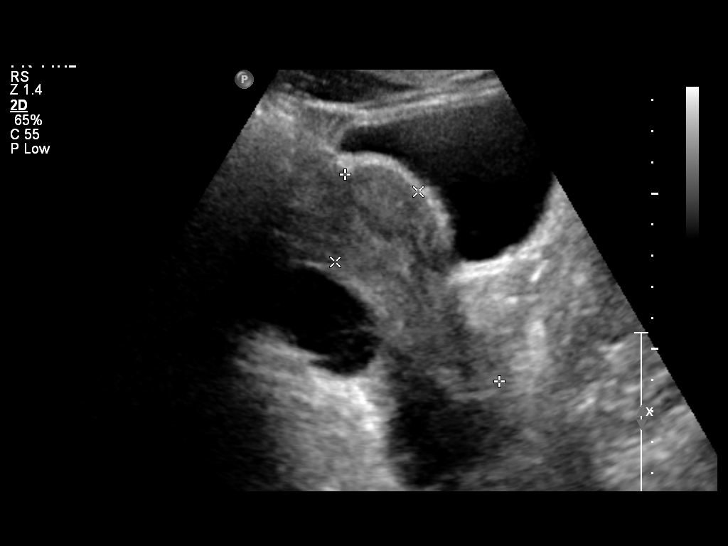
[im 6/66]
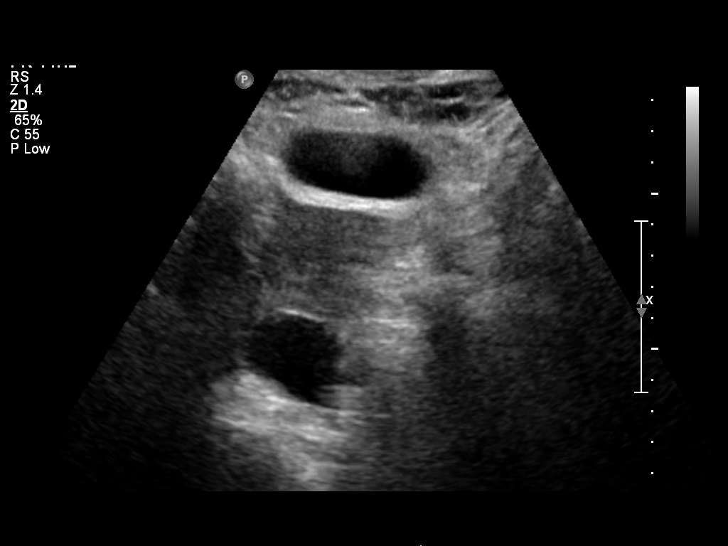
[im 11/66]
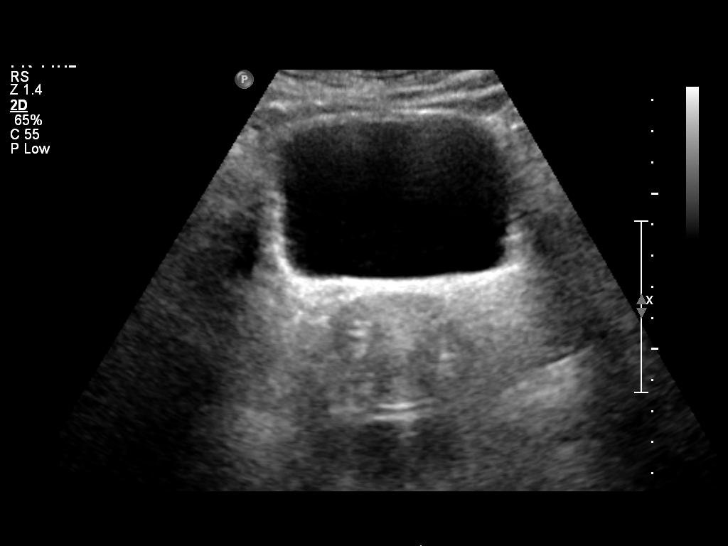
[im 17/66]
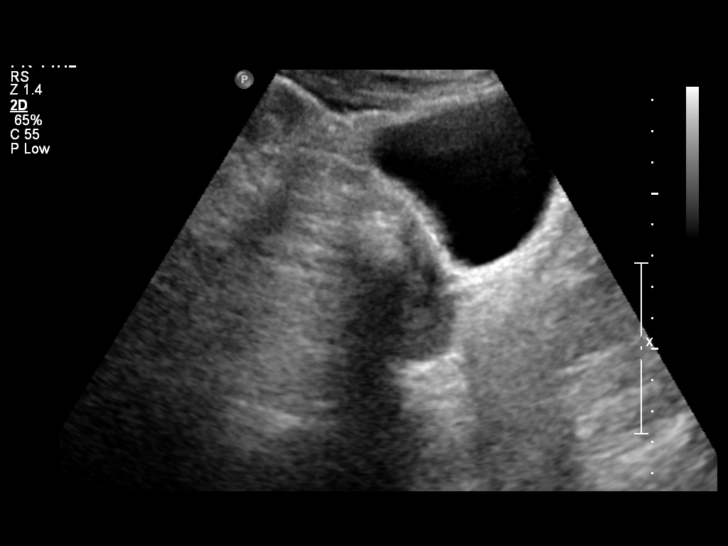
[im 22/66]
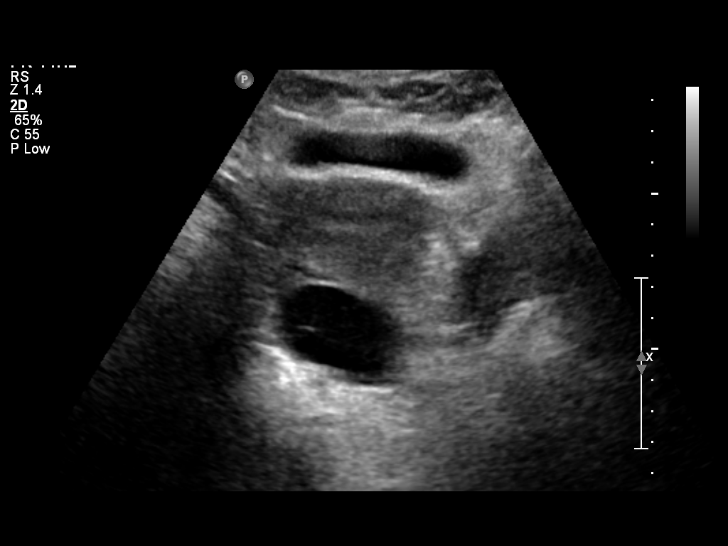
[im 28/66]
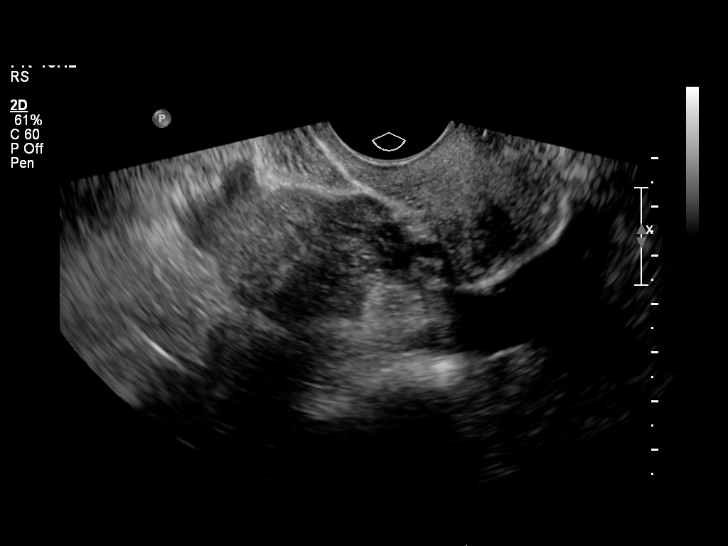
[im 33/66]
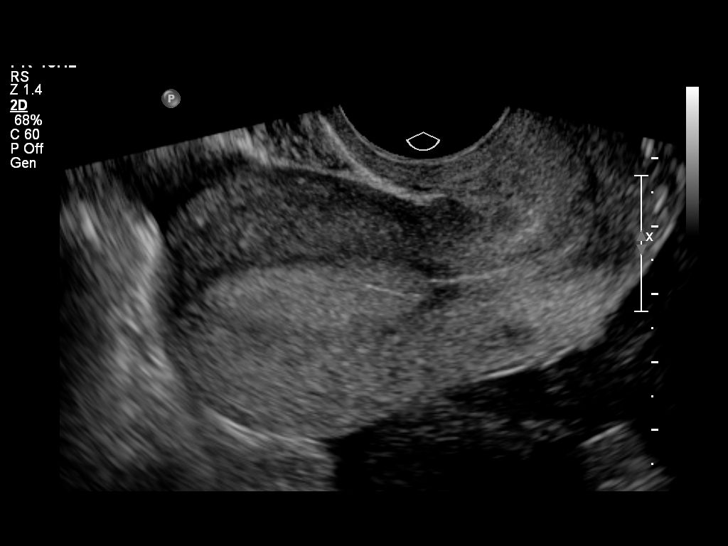
[im 38/66]
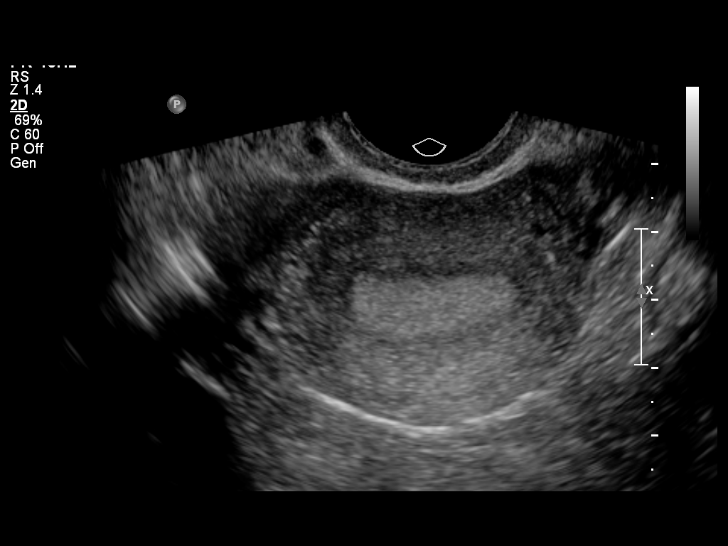
[im 44/66]
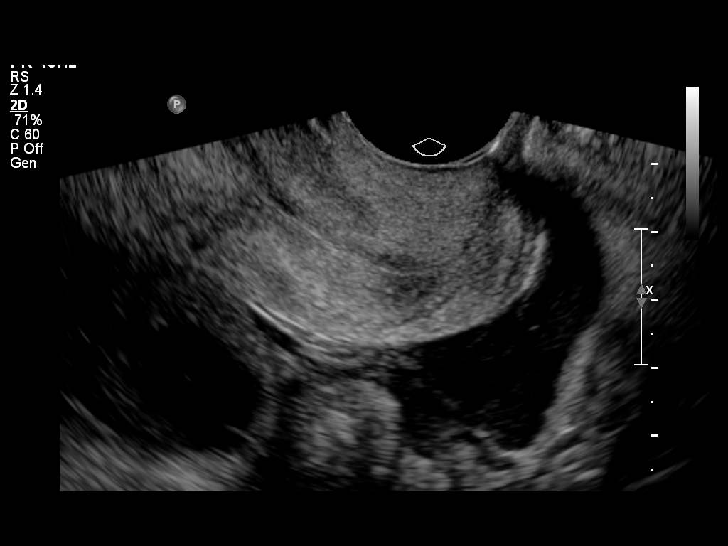
[im 49/66]
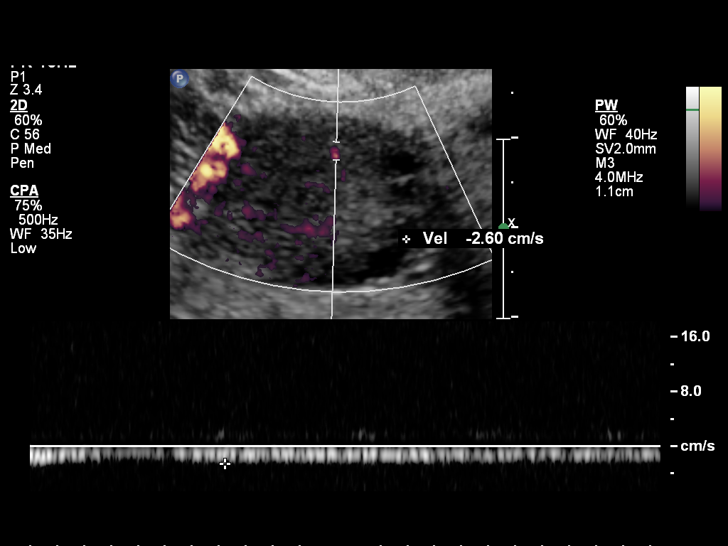
[im 55/66]
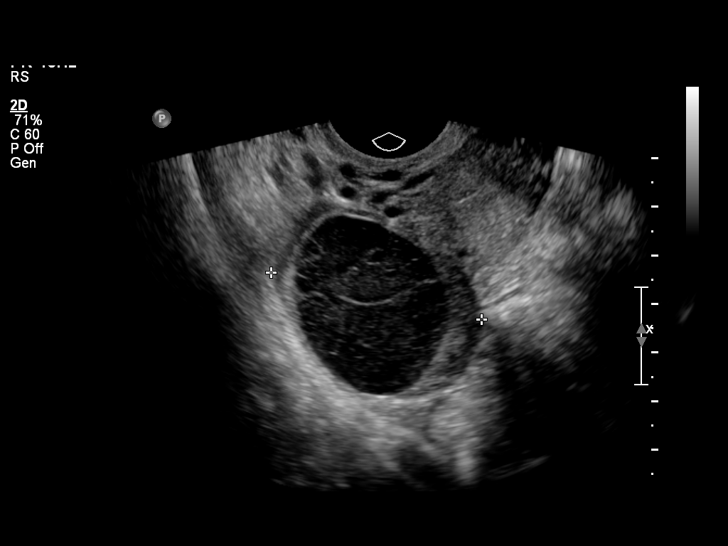
[im 60/66]
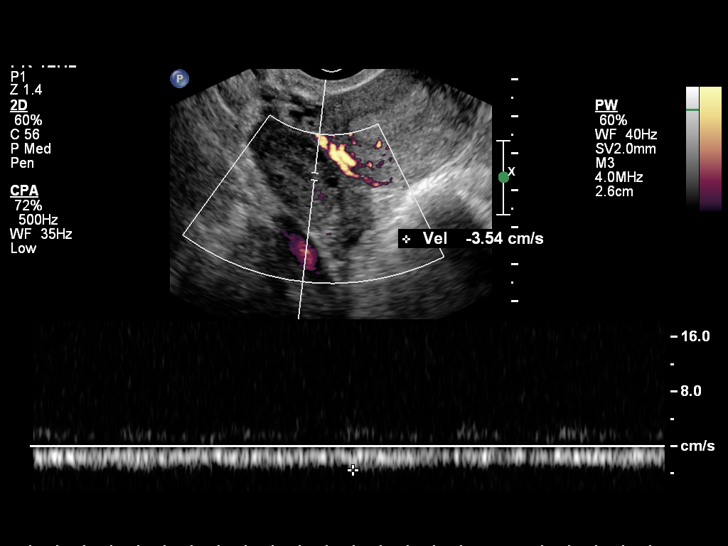
[im 66/66]
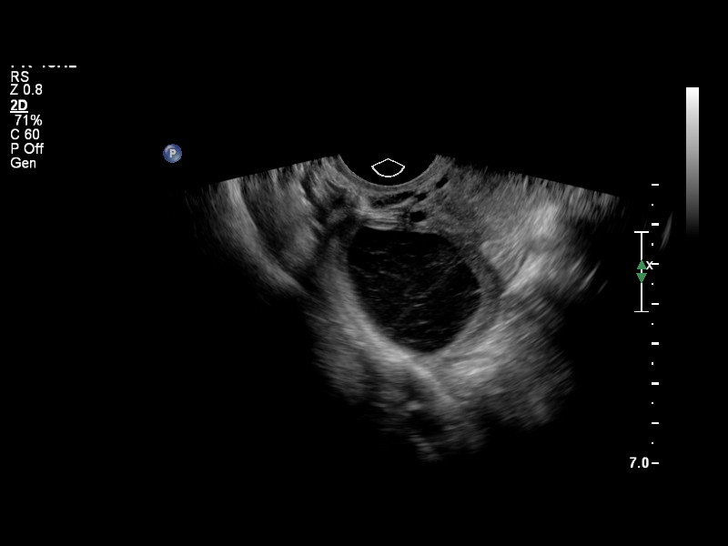

[13 of 25 positions shown; findings below may reference images not displayed]

FINDINGS: Uterus

Measurements: 7.8 x 3.8 x 4.4 cm. No fibroids or other mass
visualized.

Endometrium

Thickness: 10 mm.  No focal abnormality visualized.

Right ovary

Measurements: 6.0 x 3.8 x 4.4 cm. 4.4 x 3.4 x 3.4 cm complex/
hemorrhagic cyst.

Left ovary

Measurements: 2.6 x 2.0 x 2.2 cm. Normal appearance/no adnexal mass.

Pulsed Doppler evaluation of both ovaries demonstrates normal
low-resistance arterial and venous waveforms.

Other findings

Moderate complex fluid/ hemorrhage in the pelvic cul-de-sac.
IMPRESSION: 4.4 cm right ovarian hemorrhagic cyst.

Moderate complex fluid/hemorrhage in the pelvic cul-de-sac. Assuming
a negative pregnancy test, this is likely related to hemorrhagic
cyst rupture.

No evidence of ovarian torsion.

## 2017-01-06 ENCOUNTER — Other Ambulatory Visit: Payer: Self-pay | Admitting: Obstetrics and Gynecology

## 2017-01-06 ENCOUNTER — Other Ambulatory Visit (HOSPITAL_COMMUNITY)
Admission: RE | Admit: 2017-01-06 | Discharge: 2017-01-06 | Disposition: A | Discharge status: Home / Self Care | Payer: 59 | Source: Ambulatory Visit | Attending: Obstetrics and Gynecology | Admitting: Obstetrics and Gynecology

## 2017-01-06 DIAGNOSIS — Z113 Encounter for screening for infections with a predominantly sexual mode of transmission: Secondary | ICD-10-CM | POA: Insufficient documentation

## 2017-01-06 DIAGNOSIS — Z01419 Encounter for gynecological examination (general) (routine) without abnormal findings: Secondary | ICD-10-CM | POA: Diagnosis not present

## 2017-01-07 LAB — CYTOLOGY - PAP
Chlamydia: NEGATIVE
Diagnosis: NEGATIVE
Neisseria Gonorrhea: NEGATIVE

## 2017-09-15 NOTE — L&D Delivery Note (Signed)
Delivery Note At 3:16 PM a viable female was delivered via Vaginal, Spontaneous (Presentation: occiput anterior;  ).  APGAR: 8, 9; weight  Pending .   Placenta status: intact  , 3 vessel  Cord:  with the following complications: nucha cord x 1 reduced .  Cord pH: NA  Anesthesia: epidrual  Episiotomy: None Lacerations:  2nd degree Suture Repair: 3.0 Est. Blood Loss (mL):  100 mL  Mom to postpartum.  Baby to Couplet care / Skin to Skin.  Laymond Postle J. 11/03/2017, 3:52 PM

## 2017-11-03 ENCOUNTER — Inpatient Hospital Stay (HOSPITAL_COMMUNITY): Payer: 59 | Admitting: Anesthesiology

## 2017-11-03 ENCOUNTER — Encounter (HOSPITAL_COMMUNITY): Payer: Self-pay

## 2017-11-03 ENCOUNTER — Inpatient Hospital Stay (HOSPITAL_COMMUNITY)
Admission: AD | Admit: 2017-11-03 | Discharge: 2017-11-05 | DRG: 807 | Disposition: A | Discharge status: Home / Self Care | Payer: 59 | Source: Ambulatory Visit | Attending: Obstetrics and Gynecology | Admitting: Obstetrics and Gynecology

## 2017-11-03 DIAGNOSIS — Z87891 Personal history of nicotine dependence: Secondary | ICD-10-CM | POA: Diagnosis not present

## 2017-11-03 DIAGNOSIS — F419 Anxiety disorder, unspecified: Secondary | ICD-10-CM | POA: Diagnosis present

## 2017-11-03 DIAGNOSIS — Z3A38 38 weeks gestation of pregnancy: Secondary | ICD-10-CM

## 2017-11-03 DIAGNOSIS — F329 Major depressive disorder, single episode, unspecified: Secondary | ICD-10-CM | POA: Diagnosis present

## 2017-11-03 DIAGNOSIS — O99824 Streptococcus B carrier state complicating childbirth: Secondary | ICD-10-CM | POA: Diagnosis present

## 2017-11-03 DIAGNOSIS — O99214 Obesity complicating childbirth: Secondary | ICD-10-CM | POA: Diagnosis present

## 2017-11-03 DIAGNOSIS — O99344 Other mental disorders complicating childbirth: Secondary | ICD-10-CM | POA: Diagnosis present

## 2017-11-03 DIAGNOSIS — Z3483 Encounter for supervision of other normal pregnancy, third trimester: Secondary | ICD-10-CM | POA: Diagnosis present

## 2017-11-03 LAB — CBC
HCT: 34.3 % — ABNORMAL LOW (ref 36.0–46.0)
Hemoglobin: 11.2 g/dL — ABNORMAL LOW (ref 12.0–15.0)
MCH: 25.8 pg — AB (ref 26.0–34.0)
MCHC: 32.7 g/dL (ref 30.0–36.0)
MCV: 79 fL (ref 78.0–100.0)
PLATELETS: 247 10*3/uL (ref 150–400)
RBC: 4.34 MIL/uL (ref 3.87–5.11)
RDW: 14.4 % (ref 11.5–15.5)
WBC: 8.1 10*3/uL (ref 4.0–10.5)

## 2017-11-03 LAB — OB RESULTS CONSOLE HEPATITIS B SURFACE ANTIGEN: Hepatitis B Surface Ag: NEGATIVE

## 2017-11-03 LAB — OB RESULTS CONSOLE GBS: GBS: POSITIVE

## 2017-11-03 LAB — OB RESULTS CONSOLE GC/CHLAMYDIA
Chlamydia: NEGATIVE
Gonorrhea: NEGATIVE

## 2017-11-03 LAB — ABO/RH: ABO/RH(D): B POS

## 2017-11-03 LAB — TYPE AND SCREEN
ABO/RH(D): B POS
Antibody Screen: NEGATIVE

## 2017-11-03 LAB — OB RESULTS CONSOLE ABO/RH: RH TYPE: POSITIVE

## 2017-11-03 LAB — OB RESULTS CONSOLE HIV ANTIBODY (ROUTINE TESTING): HIV: NONREACTIVE

## 2017-11-03 LAB — OB RESULTS CONSOLE ANTIBODY SCREEN: ANTIBODY SCREEN: NEGATIVE

## 2017-11-03 LAB — OB RESULTS CONSOLE RPR: RPR: NONREACTIVE

## 2017-11-03 LAB — OB RESULTS CONSOLE RUBELLA ANTIBODY, IGM: Rubella: IMMUNE

## 2017-11-03 MED ORDER — PHENYLEPHRINE 40 MCG/ML (10ML) SYRINGE FOR IV PUSH (FOR BLOOD PRESSURE SUPPORT)
80.0000 ug | PREFILLED_SYRINGE | INTRAVENOUS | Status: DC | PRN
  Filled 2017-11-03: qty 10
  Filled 2017-11-03: qty 5

## 2017-11-03 MED ORDER — ACETAMINOPHEN 325 MG PO TABS
650.0000 mg | ORAL_TABLET | ORAL | Status: DC | PRN
  Administered 2017-11-04 (×3): 650 mg via ORAL
  Filled 2017-11-03 (×2): qty 2

## 2017-11-03 MED ORDER — LACTATED RINGERS IV SOLN
INTRAVENOUS | Status: DC
  Administered 2017-11-03: 12:00:00 via INTRAVENOUS

## 2017-11-03 MED ORDER — LIDOCAINE HCL (PF) 1 % IJ SOLN
INTRAMUSCULAR | Status: DC | PRN
  Administered 2017-11-03 (×2): 6 mL via EPIDURAL

## 2017-11-03 MED ORDER — EPHEDRINE 5 MG/ML INJ
10.0000 mg | INTRAVENOUS | Status: DC | PRN
Start: 2017-11-03 — End: 2017-11-04
  Filled 2017-11-03: qty 2

## 2017-11-03 MED ORDER — SENNOSIDES-DOCUSATE SODIUM 8.6-50 MG PO TABS
2.0000 | ORAL_TABLET | ORAL | Status: DC
  Administered 2017-11-04 (×2): 2 via ORAL
  Filled 2017-11-03 (×2): qty 2

## 2017-11-03 MED ORDER — ONDANSETRON HCL 4 MG/2ML IJ SOLN: 4.0000 mg | INTRAMUSCULAR | Status: DC | PRN

## 2017-11-03 MED ORDER — ONDANSETRON HCL 4 MG/2ML IJ SOLN: 4.0000 mg | Freq: Four times a day (QID) | INTRAMUSCULAR | Status: DC | PRN

## 2017-11-03 MED ORDER — PHENYLEPHRINE 40 MCG/ML (10ML) SYRINGE FOR IV PUSH (FOR BLOOD PRESSURE SUPPORT)
80.0000 ug | PREFILLED_SYRINGE | INTRAVENOUS | Status: DC | PRN
  Filled 2017-11-03: qty 5

## 2017-11-03 MED ORDER — DIPHENHYDRAMINE HCL 25 MG PO CAPS: 25.0000 mg | ORAL_CAPSULE | Freq: Four times a day (QID) | ORAL | Status: DC | PRN

## 2017-11-03 MED ORDER — METHYLERGONOVINE MALEATE 0.2 MG PO TABS: 0.2000 mg | ORAL_TABLET | ORAL | Status: DC | PRN

## 2017-11-03 MED ORDER — LIDOCAINE HCL (PF) 1 % IJ SOLN
30.0000 mL | INTRAMUSCULAR | Status: DC | PRN
  Filled 2017-11-03: qty 30

## 2017-11-03 MED ORDER — OXYTOCIN BOLUS FROM INFUSION
500.0000 mL | Freq: Once | INTRAVENOUS | Status: AC
  Administered 2017-11-03: 500 mL via INTRAVENOUS

## 2017-11-03 MED ORDER — OXYCODONE-ACETAMINOPHEN 5-325 MG PO TABS: 2.0000 | ORAL_TABLET | ORAL | Status: DC | PRN

## 2017-11-03 MED ORDER — ACETAMINOPHEN 325 MG PO TABS
650.0000 mg | ORAL_TABLET | ORAL | Status: DC | PRN
  Filled 2017-11-03: qty 2

## 2017-11-03 MED ORDER — BENZOCAINE-MENTHOL 20-0.5 % EX AERO
1.0000 "application " | INHALATION_SPRAY | CUTANEOUS | Status: DC | PRN
  Administered 2017-11-03: 1 via TOPICAL
  Filled 2017-11-03: qty 56

## 2017-11-03 MED ORDER — IBUPROFEN 600 MG PO TABS
600.0000 mg | ORAL_TABLET | Freq: Four times a day (QID) | ORAL | Status: DC
  Administered 2017-11-03 – 2017-11-05 (×8): 600 mg via ORAL
  Filled 2017-11-03 (×10): qty 1

## 2017-11-03 MED ORDER — PRENATAL MULTIVITAMIN CH
1.0000 | ORAL_TABLET | Freq: Every day | ORAL | Status: DC
  Administered 2017-11-04: 1 via ORAL
  Filled 2017-11-03: qty 1

## 2017-11-03 MED ORDER — EPHEDRINE 5 MG/ML INJ
10.0000 mg | INTRAVENOUS | Status: DC | PRN
  Filled 2017-11-03: qty 2

## 2017-11-03 MED ORDER — SODIUM CHLORIDE 0.9 % IV SOLN
5.0000 10*6.[IU] | Freq: Once | INTRAVENOUS | Status: AC
  Administered 2017-11-03: 5 10*6.[IU] via INTRAVENOUS
  Filled 2017-11-03: qty 5

## 2017-11-03 MED ORDER — LACTATED RINGERS IV SOLN
500.0000 mL | Freq: Once | INTRAVENOUS | Status: AC
  Administered 2017-11-03: 500 mL via INTRAVENOUS

## 2017-11-03 MED ORDER — OXYTOCIN 40 UNITS IN LACTATED RINGERS INFUSION - SIMPLE MED
2.5000 [IU]/h | INTRAVENOUS | Status: DC
  Filled 2017-11-03: qty 1000

## 2017-11-03 MED ORDER — SIMETHICONE 80 MG PO CHEW: 80.0000 mg | CHEWABLE_TABLET | ORAL | Status: DC | PRN

## 2017-11-03 MED ORDER — LACTATED RINGERS IV SOLN: 500.0000 mL | INTRAVENOUS | Status: DC | PRN

## 2017-11-03 MED ORDER — SOD CITRATE-CITRIC ACID 500-334 MG/5ML PO SOLN: 30.0000 mL | ORAL | Status: DC | PRN

## 2017-11-03 MED ORDER — COCONUT OIL OIL
1.0000 "application " | TOPICAL_OIL | Status: DC | PRN
  Administered 2017-11-04: 1 via TOPICAL
  Filled 2017-11-03: qty 120

## 2017-11-03 MED ORDER — FENTANYL 2.5 MCG/ML BUPIVACAINE 1/10 % EPIDURAL INFUSION (WH - ANES)
14.0000 mL/h | INTRAMUSCULAR | Status: DC | PRN
  Administered 2017-11-03: 14 mL/h via EPIDURAL
  Administered 2017-11-03: 12 mL/h via EPIDURAL
  Filled 2017-11-03: qty 100

## 2017-11-03 MED ORDER — SERTRALINE HCL 50 MG PO TABS
50.0000 mg | ORAL_TABLET | Freq: Every day | ORAL | Status: DC
  Administered 2017-11-04 (×2): 50 mg via ORAL
  Filled 2017-11-03 (×3): qty 1

## 2017-11-03 MED ORDER — OXYCODONE-ACETAMINOPHEN 5-325 MG PO TABS: 1.0000 | ORAL_TABLET | ORAL | Status: DC | PRN

## 2017-11-03 MED ORDER — LACTATED RINGERS IV SOLN: 500.0000 mL | Freq: Once | INTRAVENOUS | Status: DC

## 2017-11-03 MED ORDER — ZOLPIDEM TARTRATE 5 MG PO TABS: 5.0000 mg | ORAL_TABLET | Freq: Every evening | ORAL | Status: DC | PRN

## 2017-11-03 MED ORDER — DIPHENHYDRAMINE HCL 50 MG/ML IJ SOLN: 12.5000 mg | INTRAMUSCULAR | Status: DC | PRN

## 2017-11-03 MED ORDER — FENTANYL CITRATE (PF) 100 MCG/2ML IJ SOLN
50.0000 ug | INTRAMUSCULAR | Status: DC | PRN
  Administered 2017-11-03: 50 ug via INTRAVENOUS
  Filled 2017-11-03: qty 2

## 2017-11-03 MED ORDER — PENICILLIN G POT IN DEXTROSE 60000 UNIT/ML IV SOLN
3.0000 10*6.[IU] | INTRAVENOUS | Status: DC
  Filled 2017-11-03 (×9): qty 50

## 2017-11-03 MED ORDER — METHYLERGONOVINE MALEATE 0.2 MG/ML IJ SOLN: 0.2000 mg | INTRAMUSCULAR | Status: DC | PRN

## 2017-11-03 MED ORDER — DIBUCAINE 1 % RE OINT: 1.0000 "application " | TOPICAL_OINTMENT | RECTAL | Status: DC | PRN

## 2017-11-03 MED ORDER — ONDANSETRON HCL 4 MG PO TABS: 4.0000 mg | ORAL_TABLET | ORAL | Status: DC | PRN

## 2017-11-03 MED ORDER — WITCH HAZEL-GLYCERIN EX PADS: 1.0000 "application " | MEDICATED_PAD | CUTANEOUS | Status: DC | PRN

## 2017-11-03 NOTE — Anesthesia Pain Management Evaluation Note (Signed)
  CRNA Pain Management Visit Note  Patient: Virginia Howard, 27 y.o., female  "[facility] We have an anesthesia team available at all times to provide care throughout the hospital, including epidural management and anesthesia for C-section. I don't know your plan for the delivery whether it a natural birth, water birth, IV sedation, nitrous supplementation, doula or epidural, but we want to meet your pain goals."   1.Was your pain managed to your expectations on prior hospitalizations?   No prior hospitalizations  2.What is your expectation for pain management during this hospitalization?     Epidural  3.How can we help you reach that goal? unsure  Record the patient's initial score and the patient's pain goal.   Pain: 0  Pain Goal:8 [facility] wants you to be able to say your pain was always managed very well.  Cephus ShellingBURGER,Jaquia Benedicto 11/03/2017

## 2017-11-03 NOTE — H&P (Signed)
Virginia Howard is a 27 y.o. female presenting complaining of regualar contractions she is 5 cm on arrival. Intact. + FM. Marland Kitchen.Pregnancy  Complicated depression and anxiety.   Prenatal care provided by Dr. Gerald Leitzara Amel Gianino  OB History    Rhett BannisterGravida Para Term Preterm AB Living   1 0 0 0   0   SAB TAB Ectopic Multiple Live Births   0 0 0 0 0     History reviewed. No pertinent past medical history. History reviewed. No pertinent surgical history. Family History: family history includes Diabetes in her maternal grandmother and paternal grandmother; Hypertension in her father and maternal grandmother. Social History:  reports that she quit smoking about 6 months ago. Her smoking use included cigarettes. she has never used smokeless tobacco. She reports that she drinks alcohol. She reports that she does not use drugs.     Maternal Diabetes: No Genetic Screening: Normal Maternal Ultrasounds/Referrals: Declined Fetal Ultrasounds or other Referrals:  None Maternal Substance Abuse:  No Significant Maternal Medications:  Meds include: Zoloft Significant Maternal Lab Results:  Lab values include: Group B Strep positive Other Comments:  None  Review of Systems  Constitutional: Negative.   HENT: Negative.   Eyes: Negative.   Respiratory: Negative.   Cardiovascular: Negative.   Gastrointestinal: Negative.   Genitourinary: Negative.   Musculoskeletal: Positive for back pain.  Skin: Negative.   Neurological: Negative.   Endo/Heme/Allergies: Negative.   Psychiatric/Behavioral: Negative.    History Dilation: 5 Effacement (%): 90 Exam by:: ginger morris rn Blood pressure 117/79, pulse 68, temperature 98.2 F (36.8 C), temperature source Oral, resp. rate 16, height 5\' 5"  (1.651 m), weight 122.5 kg (270 lb), last menstrual period 02/04/2017, SpO2 97 %. Exam Physical Exam  Vitals reviewed. Constitutional: She is oriented to person, place, and time. She appears well-developed and well-nourished.  HENT:   Head: Normocephalic and atraumatic.  Eyes: Conjunctivae are normal. Pupils are equal, round, and reactive to light.  Neck: Normal range of motion. Neck supple.  Cardiovascular: Normal rate and regular rhythm.  Respiratory: Effort normal and breath sounds normal.  GI: There is no tenderness.  Musculoskeletal: Normal range of motion. She exhibits edema.  Neurological: She is alert and oriented to person, place, and time.  Skin: Skin is warm and dry.  Psychiatric: She has a normal mood and affect.    Prenatal labs: ABO, Rh: --/--/B POS (02/19 1030) Antibody: NEG (02/19 1030) Rubella: Immune (02/19 1028) RPR: Nonreactive (02/19 1028)  HBsAg: Negative (02/19 1028)  HIV: Non-reactive (02/19 1028)  GBS: Positive (02/19 1028)   Assessment/Plan: 38 wks and 6 days in active labor  GBS positive - Penicillin  Anticipate SVD   Chevonne Bostrom J. 11/03/2017, 12:41 PM

## 2017-11-03 NOTE — Progress Notes (Signed)
Dr Richardson Doppole notified of Pt's VE, orders received to admit pt

## 2017-11-03 NOTE — Anesthesia Procedure Notes (Signed)
Epidural Patient location during procedure: OB Start time: 11/03/2017 11:53 AM End time: 11/03/2017 12:37 PM  Staffing Anesthesiologist: Jairo BenJackson, Rosabell Geyer, MD Performed: anesthesiologist   Preanesthetic Checklist Completed: patient identified, surgical consent, pre-op evaluation, timeout performed, IV checked, risks and benefits discussed and monitors and equipment checked  Epidural Patient position: sitting Prep: site prepped and draped and DuraPrep Patient monitoring: heart rate, continuous pulse ox and blood pressure Approach: midline Location: L3-L4 Injection technique: LOR air  Needle:  Needle type: Tuohy  Needle gauge: 17 G Needle length: 9 cm Needle insertion depth: 9 cm Catheter type: closed end flexible Catheter size: 19 Gauge Catheter at skin depth: 17 cm Test dose: negative (1% lidocaine)  Assessment Events: blood not aspirated, injection not painful, no injection resistance, negative IV test and no paresthesia  Additional Notes Pt identified in Labor room.  Monitors applied. Working IV access confirmed. Sterile prep, drape lumbar spine.  1% lido local L 2,3.  #17ga Touhy, alll attempts os, repeat local L3,4 and LOR air at 9 cm L 2,3, cath in easily to 17 cm skin. Test dose OK, cath dosed and infusion begun.  Patient asymptomatic, VSS, no heme aspirated, tolerated well.  Sandford Craze Batul Diego, MDReason for block:procedure for pain

## 2017-11-03 NOTE — MAU Note (Signed)
Pt started having ctx last night, now 5 minutes apart. Feeling pressure and ctx 8/10. No LOF or bleeding, + FM

## 2017-11-03 NOTE — Anesthesia Preprocedure Evaluation (Signed)
Anesthesia Evaluation  Patient identified by MRN, date of birth, ID band Patient awake    Reviewed: Allergy & Precautions, NPO status , Patient's Chart, lab work & pertinent test results  History of Anesthesia Complications Negative for: history of anesthetic complications  Airway Mallampati: III  TM Distance: >3 FB Neck ROM: Full    Dental  (+) Dental Advisory Given   Pulmonary former smoker,    breath sounds clear to auscultation       Cardiovascular negative cardio ROS   Rhythm:Regular Rate:Normal     Neuro/Psych negative neurological ROS     GI/Hepatic Neg liver ROS, GERD  Poorly Controlled,  Endo/Other  Morbid obesity  Renal/GU negative Renal ROS     Musculoskeletal   Abdominal (+) + obese,   Peds  Hematology   Anesthesia Other Findings   Reproductive/Obstetrics (+) Pregnancy                             Anesthesia Physical Anesthesia Plan  ASA: III  Anesthesia Plan: Epidural   Post-op Pain Management:    Induction:   PONV Risk Score and Plan: Treatment may vary due to age or medical condition  Airway Management Planned: Natural Airway  Additional Equipment:   Intra-op Plan:   Post-operative Plan:   Informed Consent: I have reviewed the patients History and Physical, chart, labs and discussed the procedure including the risks, benefits and alternatives for the proposed anesthesia with the patient or authorized representative who has indicated his/her understanding and acceptance.     Plan Discussed with:   Anesthesia Plan Comments: (Patient identified. Risks/Benefits/Options discussed with patient including but not limited to bleeding, infection, nerve damage, paralysis, failed block, incomplete pain control, headache, blood pressure changes, nausea, vomiting, reactions to medication both or allergic, itching and postpartum back pain. Confirmed with bedside nurse the  patient's most recent platelet count. Confirmed with patient that they are not currently taking any anticoagulation, have any bleeding history or any family history of bleeding disorders. Patient expressed understanding and wished to proceed. All questions were answered. )        Anesthesia Quick Evaluation

## 2017-11-03 NOTE — Lactation Note (Signed)
This note was copied from a baby's chart. Lactation Consultation Note  Patient Name: Boy Serena ColonelJasmine Garlington ZOXWR'UToday's Date: 11/03/2017 Reason for consult: Initial assessment;Primapara;1st time breastfeeding;Early term 2337-38.6wks  5 hours old female who is being exclusively BF by his mother, she's a P1. Went in the room twice to assess latch (upon mom's request) but baby was asleep both times. Taught how to hand express, mom was able to get a small drop of colostrum on a spoon and she will spoon feed baby when he wakes up (or dab it with her finger, it was a really small drop like 0.1 ml).  Mom states that feeding at the breast are comfortable but she hasn't heard swallows when baby is at the breast. Explained to mom the importance of breast massage and hand expression in order to help supplementing baby with mother's milk if necessary. Mom will call for assistance if she needs to latch baby on.  Encouraged mom to put baby on the breast at least 8-12 times a day on feeding cues but also waking him up every 3 hours during the day and put him STS to give him an opportunity to feed. Reviewed BF brochure, BF resources and feeding diary, mom is aware of LC services and will call PRN.  Maternal Data Formula Feeding for Exclusion: No Has patient been taught Hand Expression?: Yes Does the patient have breastfeeding experience prior to this delivery?: No  Interventions Interventions: Breast feeding basics reviewed;Breast massage;Breast compression;Hand express;Expressed milk  Lactation Tools Discussed/Used Tools: Other (comment)(spoon) WIC Program: No   Consult Status Consult Status: Follow-up Date: 11/04/17 Follow-up type: In-patient    Shailyn Weyandt Venetia ConstableS Chukwudi Ewen 11/03/2017, 8:50 PM

## 2017-11-04 ENCOUNTER — Other Ambulatory Visit: Payer: Self-pay

## 2017-11-04 LAB — CBC
HCT: 31.2 % — ABNORMAL LOW (ref 36.0–46.0)
Hemoglobin: 10.3 g/dL — ABNORMAL LOW (ref 12.0–15.0)
MCH: 26.2 pg (ref 26.0–34.0)
MCHC: 33 g/dL (ref 30.0–36.0)
MCV: 79.4 fL (ref 78.0–100.0)
PLATELETS: 238 10*3/uL (ref 150–400)
RBC: 3.93 MIL/uL (ref 3.87–5.11)
RDW: 14.3 % (ref 11.5–15.5)
WBC: 12.8 10*3/uL — ABNORMAL HIGH (ref 4.0–10.5)

## 2017-11-04 LAB — RPR: RPR: NONREACTIVE

## 2017-11-04 MED ORDER — OXYCODONE HCL 5 MG PO TABS
5.0000 mg | ORAL_TABLET | ORAL | Status: DC | PRN
  Administered 2017-11-04: 5 mg via ORAL
  Filled 2017-11-04: qty 1

## 2017-11-04 MED ORDER — OXYCODONE HCL 5 MG PO TABS: 10.0000 mg | ORAL_TABLET | ORAL | Status: DC | PRN

## 2017-11-04 NOTE — Anesthesia Postprocedure Evaluation (Signed)
Anesthesia Post Note  Patient: Virginia Howard  Procedure(s) Performed: AN AD HOC LABOR EPIDURAL     Patient location during evaluation: Mother Baby Anesthesia Type: Epidural Level of consciousness: awake and alert Pain management: pain level controlled Vital Signs Assessment: post-procedure vital signs reviewed and stable Respiratory status: spontaneous breathing, nonlabored ventilation and respiratory function stable Cardiovascular status: stable Postop Assessment: no headache, no backache and epidural receding Anesthetic complications: no    Last Vitals:  Vitals:   11/03/17 2055 11/04/17 0517  BP: (!) 119/56 131/74  Pulse: 79 75  Resp: 18 18  Temp: 37.2 C 36.9 C  SpO2:      Last Pain:  Vitals:   11/04/17 0635  TempSrc:   PainSc: Asleep   Pain Goal:                 Marrion CoyMERRITT,[name]

## 2017-11-04 NOTE — Progress Notes (Signed)
Post Partum Day 1 Subjective: no complaints, up ad lib, voiding and tolerating PO  Objective: Blood pressure 131/74, pulse 75, temperature 98.4 F (36.9 C), temperature source Oral, resp. rate 18, height 5\' 5"  (1.651 m), weight 122.5 kg (270 lb), last menstrual period 02/04/2017, SpO2 97 %, unknown if currently breastfeeding.  Physical Exam:  General: alert, cooperative and no distress Lochia: appropriate Uterine Fundus: firm Incision: NA DVT Evaluation: No evidence of DVT seen on physical exam.  Recent Labs    11/03/17 1030 11/04/17 0522  HGB 11.2* 10.3*  HCT 34.3* 31.2*    Assessment/Plan: Plan for discharge tomorrow and Breastfeeding  Baby has mild chordee .Marland Kitchen. No circ inpatient.    LOS: 1 day   Wilmetta Speiser J. 11/04/2017, 8:39 AM

## 2017-11-04 NOTE — Progress Notes (Signed)
CSW received consult for Edinburgh Postnatal Depression Scale score of 9.  CSW should be consulted for scores of 10 and greater or positive answers to question 10.  CSW also notes that [facility] has Rx for Zoloft.  CSW spoke with bedside RN to see if any current concerns have been noted and she denies.  CSW is screening out referral. 

## 2017-11-05 MED ORDER — BISACODYL 5 MG PO TBEC
5.0000 mg | DELAYED_RELEASE_TABLET | Freq: Once | ORAL | Status: AC
  Administered 2017-11-05: 5 mg via ORAL
  Filled 2017-11-05 (×2): qty 1

## 2017-11-05 MED ORDER — IBUPROFEN 800 MG PO TABS: ORAL_TABLET | ORAL | 1 refills | Status: AC

## 2017-11-05 NOTE — Lactation Note (Signed)
This note was copied from a baby's chart. Lactation Consultation Note; Mom getting ready to latch baby as I went into room. Reports NS is helping a lot with her pain. Baby latched well. Needed some stimulation to keep nursing. States she is feeling much better about nursing. Has Evenflo pump for home. Encouraged to pump 4-6 times/day to promote milk supply and to feed all EBM to baby. Reviewed our [phone number, OP appointments and BFSG as resources for support after DC. No questions at present. To call prn  Patient Name: Virginia Howard ZOXWR'UToday's Date: 11/05/2017 Reason for consult: Follow-up assessment   Maternal Data Formula Feeding for Exclusion: No Has patient been taught Hand Expression?: Yes Does the patient have breastfeeding experience prior to this delivery?: No  Feeding Feeding Type: Breast Fed Length of feed: (sleepy, would not latch)  LATCH Score Latch: Grasps breast easily, tongue down, lips flanged, rhythmical sucking.  Audible Swallowing: A few with stimulation  Type of Nipple: Everted at rest and after stimulation  Comfort (Breast/Nipple): Soft / non-tender  Hold (Positioning): Assistance needed to correctly position infant at breast and maintain latch.  LATCH Score: 8  Interventions Interventions: Breast feeding basics reviewed;Assisted with latch;Hand express  Lactation Tools Discussed/Used Tools: Nipple Shields Nipple shield size: 20 Breast pump type: Manual   Consult Status Consult Status: Complete    Pamelia HoitWeeks, Lior Hoen D 11/05/2017, 10:16 AM

## 2017-11-05 NOTE — Discharge Summary (Signed)
OB Discharge Summary     Patient Name: Serena ColonelJasmine Fang DOB: 06/26/1991 MRN: [id number]  Date of admission: 11/03/2017 Delivering MD: Gerald LeitzOLE, Shannel Zahm   Date of discharge: 11/05/2017  Admitting diagnosis: LABOR Intrauterine pregnancy: 5133w6d     Secondary diagnosis:  Active Problems:   Normal labor  Additional problems: None     Discharge diagnosis: Term Pregnancy Delivered                                                                                                Post partum procedures:None  Augmentation: none  Complications: None  Hospital course:  Onset of Labor With Vaginal Delivery     27 y.o. yo G1P1001 at 2633w6d was admitted in Active Labor on 11/03/2017. Patient had an uncomplicated labor course as follows:  Membrane Rupture Time/Date: 2:47 PM ,11/03/2017   Intrapartum Procedures: Episiotomy: None [1]                                         Lacerations:  2nd degree [3];Perineal [11]  Patient had a delivery of a Viable infant. 11/03/2017  Information for the patient's newborn:  Dena Billeteele, Boy Andreah [[id number]][[id number]]  Delivery Method: Vaginal, Spontaneous(Filed from Delivery Summary)    Pateint had an uncomplicated postpartum course.  She is ambulating, tolerating a regular diet, passing flatus, and urinating well. Patient is discharged home in stable condition on 11/05/17.   Physical exam  Vitals:   11/03/17 2055 11/04/17 0517 11/04/17 1906 11/05/17 0530  BP: (!) 119/56 131/74  117/66  Pulse: 79 75  62  Resp: 18 18 18 16   Temp: 99 F (37.2 C) 98.4 F (36.9 C) 98.3 F (36.8 C) 98.8 F (37.1 C)  TempSrc: Oral Oral Oral Oral  SpO2:      Weight:      Height:       General: alert, cooperative and no distress Lochia: appropriate Uterine Fundus: firm Incision: N/A DVT Evaluation: No evidence of DVT seen on physical exam. Labs: Lab Results  Component Value Date   WBC 12.8 (H) 11/04/2017   HGB 10.3 (L) 11/04/2017   HCT 31.2 (L) 11/04/2017   MCV 79.4 11/04/2017   PLT 238  11/04/2017   CMP Latest Ref Rng & Units 07/06/2014  Glucose 70 - 99 mg/dL 99  BUN 6 - 23 mg/dL 15  Creatinine 0.860.50 - 5.781.10 mg/dL 4.690.80  Sodium 629137 - 528147 mEq/L 137  Potassium 3.7 - 5.3 mEq/L 4.3  Chloride 96 - 112 mEq/L 104    Discharge instruction: per After Visit Summary and "Baby and Me Booklet".  After visit meds:  Allergies as of 11/05/2017   No Known Allergies     Medication List    TAKE these medications   acetaminophen 500 MG tablet Commonly known as:  TYLENOL Take 500 mg by mouth every 6 (six) hours as needed for mild pain or headache.   ibuprofen 800 MG tablet Commonly known as:  ADVIL,MOTRIN 1 po every 8 hours as needed  prenatal multivitamin Tabs tablet Take 1 tablet by mouth daily at 12 noon.   sertraline 50 MG tablet Commonly known as:  ZOLOFT Take 50 mg by mouth daily.       Diet: low salt diet  Activity: Advance as tolerated. Pelvic rest for 6 weeks.   Outpatient follow up:2 weeks Follow up Appt:No future appointments. Follow up Visit:No Follow-up on file.  Postpartum contraception: Not Discussed  Newborn Data: Live born female  Birth Weight: 6 lb 4 oz (2835 g) APGAR: 8, 9  Newborn Delivery   Birth date/time:  11/03/2017 15:16:00 Delivery type:  Vaginal, Spontaneous     Baby Feeding: Breast Disposition:home with mother   11/05/2017 Jessee Avers., MD

## 2017-11-05 NOTE — Lactation Note (Signed)
This note was copied from a baby's chart. Lactation Consultation Note Baby 733 hrs old. Mom c/o painful latch. Mom BF in cradle position when LC entered Rm. RN assisting in obtaining latch. Baby had a beautiful flange. Cheeks to breast, good rhythm, yet c/o pain of 7/8. Unlatched baby, noted nipple lipstick appearance. Bruised. Mom has short shaft everted nipples. Semi compressible. Not able to compress deep back behind nipple to elongate for baby to obtain deep latch.  Breast are tubular w/nipples at the bottom end of breast. Mom has to lift up breast to see nipple in order to apply NS. Application of NS taught.  Fitted mom w/#20 NS. Hand express a drop of thick colostrum. Mom's nipples tender. Has coconut oil.  Baby has high narrow palate. Has good rhythm when suckling on gloved finger.  Latched baby in football hold. Taught "C" hold. Baby latched well. Noted when baby popped of of breast small amount of colostrum in NS. No swallows heard though.  Hand pump given to pre-pump to evert nipple more, also for stimulation. Encouraged to use colostrum first to sore nipples, then coconut oil at separate time.  Discussed body alignment, positions, support, and comfort. Encouraged to call for assistance or questions.   Patient Name: Virginia Howard ZOXWR'UToday's Date: 11/05/2017 Reason for consult: Follow-up assessment;Mother's request;Difficult latch;Nipple pain/trauma   Maternal Data    Feeding Feeding Type: Breast Fed Length of feed: (feeding)  LATCH Score Latch: Repeated attempts needed to sustain latch, nipple held in mouth throughout feeding, stimulation needed to elicit sucking reflex.  Audible Swallowing: None  Type of Nipple: Everted at rest and after stimulation  Comfort (Breast/Nipple): Filling, red/small blisters or bruises, mild/mod discomfort  Hold (Positioning): Assistance needed to correctly position infant at breast and maintain latch.  LATCH Score:  5  Interventions Interventions: Breast feeding basics reviewed;Assisted with latch;Breast compression;Coconut oil;Skin to skin;Adjust position;Breast massage;Support pillows;Hand pump;Hand express;Position options;Expressed milk;Pre-pump if needed  Lactation Tools Discussed/Used Tools: Pump;Coconut oil;Nipple Shields Nipple shield size: 20 Breast pump type: Manual Pump Review: Setup, frequency, and cleaning;Milk Storage Initiated by:: Peri JeffersonL. Arish Redner RN IBCLC Date initiated:: 11/05/17   Consult Status Consult Status: Follow-up Date: 11/05/17 Follow-up type: In-patient    Virginia Howard, Diamond NickelLAURA G 11/05/2017, 1:01 AM

## 2017-11-12 ENCOUNTER — Encounter (HOSPITAL_COMMUNITY): Payer: Self-pay

## 2017-11-12 ENCOUNTER — Inpatient Hospital Stay (HOSPITAL_COMMUNITY)
Admission: AD | Admit: 2017-11-12 | Discharge: 2017-11-12 | Disposition: A | Discharge status: Home / Self Care | Payer: 59 | Source: Ambulatory Visit | Attending: Obstetrics & Gynecology | Admitting: Obstetrics & Gynecology

## 2017-11-12 DIAGNOSIS — Z87891 Personal history of nicotine dependence: Secondary | ICD-10-CM | POA: Insufficient documentation

## 2017-11-12 DIAGNOSIS — O165 Unspecified maternal hypertension, complicating the puerperium: Secondary | ICD-10-CM | POA: Diagnosis present

## 2017-11-12 LAB — COMPREHENSIVE METABOLIC PANEL
ALT: 13 U/L — AB (ref 14–54)
AST: 12 U/L — AB (ref 15–41)
Albumin: 3.2 g/dL — ABNORMAL LOW (ref 3.5–5.0)
Alkaline Phosphatase: 117 U/L (ref 38–126)
Anion gap: 9 (ref 5–15)
BILIRUBIN TOTAL: 0.8 mg/dL (ref 0.3–1.2)
BUN: 7 mg/dL (ref 6–20)
CO2: 26 mmol/L (ref 22–32)
Calcium: 8.5 mg/dL — ABNORMAL LOW (ref 8.9–10.3)
Chloride: 104 mmol/L (ref 101–111)
Creatinine, Ser: 0.84 mg/dL (ref 0.44–1.00)
GFR calc Af Amer: >60 mL/min (ref >60)
Glucose, Bld: 91 mg/dL (ref 65–99)
Potassium: 3.3 mmol/L — ABNORMAL LOW (ref 3.5–5.1)
Sodium: 139 mmol/L (ref 135–145)
TOTAL PROTEIN: 7.1 g/dL (ref 6.5–8.1)

## 2017-11-12 LAB — URINALYSIS, ROUTINE W REFLEX MICROSCOPIC
BILIRUBIN URINE: NEGATIVE
Glucose, UA: NEGATIVE mg/dL
Ketones, ur: NEGATIVE mg/dL
NITRITE: NEGATIVE
PH: 7 (ref 5.0–8.0)
Protein, ur: NEGATIVE mg/dL
SPECIFIC GRAVITY, URINE: 1.006 (ref 1.005–1.030)

## 2017-11-12 LAB — CBC
HEMATOCRIT: 36 % (ref 36.0–46.0)
Hemoglobin: 11.6 g/dL — ABNORMAL LOW (ref 12.0–15.0)
MCH: 26 pg (ref 26.0–34.0)
MCHC: 32.2 g/dL (ref 30.0–36.0)
MCV: 80.7 fL (ref 78.0–100.0)
Platelets: 293 10*3/uL (ref 150–400)
RBC: 4.46 MIL/uL (ref 3.87–5.11)
RDW: 14.3 % (ref 11.5–15.5)
WBC: 6.5 10*3/uL (ref 4.0–10.5)

## 2017-11-12 LAB — PROTEIN / CREATININE RATIO, URINE
CREATININE, URINE: 103 mg/dL
PROTEIN CREATININE RATIO: 0.08 mg/mg{creat} (ref 0.00–0.15)
TOTAL PROTEIN, URINE: 8 mg/dL

## 2017-11-12 MED ORDER — NIFEDIPINE ER OSMOTIC RELEASE 30 MG PO TB24: 30.0000 mg | ORAL_TABLET | Freq: Every day | ORAL | 0 refills | Status: AC

## 2017-11-12 MED ORDER — ACETAMINOPHEN 500 MG PO TABS
1000.0000 mg | ORAL_TABLET | Freq: Once | ORAL | Status: AC
  Administered 2017-11-12: 1000 mg via ORAL
  Filled 2017-11-12: qty 2

## 2017-11-12 NOTE — MAU Provider Note (Signed)
History     CSN: [id number]  Arrival date and time: 11/12/17 1629   First Provider Initiated Contact with Patient 11/12/17 1659      Chief Complaint  Patient presents with  . Hypertension   HPI  Virginia Howard is a 27 y.o. G33P1001 female who presents with hypertension and headache. Had SVD on 11/03/17. Denies history of hypertension during pregnancy nor outside of pregnancy.  Home health nurse came to her home today and took her BP which was 150s/90s. Reports headache for the last 2 days. Describes as frontal ache like pain that she rates 5/10. Has been taking tylenol with relief, but states the headache returns. Took 1 ES tylenol this morning. Denies visual disturbance or epigastric pain.   OB History    Gravida Para Term Preterm AB Living   1 1 1  0   1   SAB TAB Ectopic Multiple Live Births   0 0 0 0 1      History reviewed. No pertinent past medical history.  History reviewed. No pertinent surgical history.  Family History  Problem Relation Age of Onset  . Hypertension Father   . Diabetes Maternal Grandmother   . Hypertension Maternal Grandmother   . Diabetes Paternal Grandmother     Social History   Tobacco Use  . Smoking status: Former Smoker    Types: Cigarettes    Last attempt to quit: 05/03/2017    Years since quitting: 0.5  . Smokeless tobacco: Never Used  Substance Use Topics  . Alcohol use: Yes    Comment: socially  . Drug use: No    Allergies: No Known Allergies  Medications Prior to Admission  Medication Sig Dispense Refill Last Dose  . acetaminophen (TYLENOL) 500 MG tablet Take 500 mg by mouth every 6 (six) hours as needed for mild pain or headache.   11/02/2017 at Unknown time  . ibuprofen (ADVIL,MOTRIN) 800 MG tablet 1 po every 8 hours as needed 30 tablet 1   . Prenatal Vit-Fe Fumarate-FA (PRENATAL MULTIVITAMIN) TABS tablet Take 1 tablet by mouth daily at 12 noon.   Past Week at Unknown time  . sertraline (ZOLOFT) 50 MG tablet Take 50 mg by mouth  daily.    Past Week at Unknown time    Review of Systems  Constitutional: Negative.   Eyes: Negative for visual disturbance.  Respiratory: Negative.   Gastrointestinal: Negative.   Neurological: Positive for headaches.   Physical Exam   Blood pressure (!) 143/92, pulse (!) 56, temperature 98.9 F (37.2 C), resp. rate 16, height 5\' 5"  (1.651 m), weight 253 lb (114.8 kg), unknown if currently breastfeeding. Patient Vitals for the past 24 hrs:  BP Temp Pulse Resp Height Weight  11/12/17 1838 - - - 16 - -  11/12/17 1802 (!) 143/92 - (!) 56 - - -  11/12/17 1742 (!) 148/92 - (!) 55 - - -  11/12/17 1721 (!) 115/93 - 77 - - -  11/12/17 1702 126/84 - 74 - - -  11/12/17 1654 127/74 - 73 - - -  11/12/17 1645 134/85 98.9 F (37.2 C) 78 18 5\' 5"  (1.651 m) 253 lb (114.8 kg)    Physical Exam  Nursing note and vitals reviewed. Constitutional: She is oriented to person, place, and time. She appears well-developed and well-nourished. No distress.  HENT:  Head: Normocephalic and atraumatic.  Eyes: Conjunctivae are normal. Right eye exhibits no discharge. Left eye exhibits no discharge. No scleral icterus.  Neck: Normal range of motion.  Cardiovascular: Normal rate, regular rhythm and normal heart sounds.  No murmur heard. Respiratory: Effort normal and breath sounds normal. No respiratory distress. She has no wheezes.  GI: Soft. There is no tenderness.  Musculoskeletal: She exhibits no edema.  Neurological: She is alert and oriented to person, place, and time. She has normal reflexes.  No clonus  Skin: Skin is warm and dry. She is not diaphoretic.  Psychiatric: She has a normal mood and affect. Her behavior is normal. Judgment and thought content normal.    MAU Course  Procedures Results for orders placed or performed during the hospital encounter of 11/12/17 (from the past 24 hour(s))  Protein / creatinine ratio, urine     Status: None   Collection Time: 11/12/17  4:35 PM  Result Value  Ref Range   Creatinine, Urine 103.00 mg/dL   Total Protein, Urine 8 mg/dL   Protein Creatinine Ratio 0.08 0.00 - 0.15 mg/mg[Cre]  Urinalysis, Routine w reflex microscopic     Status: Abnormal   Collection Time: 11/12/17  4:35 PM  Result Value Ref Range   Color, Urine YELLOW YELLOW   APPearance CLEAR CLEAR   Specific Gravity, Urine 1.006 1.005 - 1.030   pH 7.0 5.0 - 8.0   Glucose, UA NEGATIVE NEGATIVE mg/dL   Hgb urine dipstick LARGE (A) NEGATIVE   Bilirubin Urine NEGATIVE NEGATIVE   Ketones, ur NEGATIVE NEGATIVE mg/dL   Protein, ur NEGATIVE NEGATIVE mg/dL   Nitrite NEGATIVE NEGATIVE   Leukocytes, UA MODERATE (A) NEGATIVE   RBC / HPF 0-5 0 - 5 RBC/hpf   WBC, UA 6-30 0 - 5 WBC/hpf   Bacteria, UA RARE (A) NONE SEEN   Squamous Epithelial / LPF 0-5 (A) NONE SEEN  CBC     Status: Abnormal   Collection Time: 11/12/17  5:18 PM  Result Value Ref Range   WBC 6.5 4.0 - 10.5 K/uL   RBC 4.46 3.87 - 5.11 MIL/uL   Hemoglobin 11.6 (L) 12.0 - 15.0 g/dL   HCT 16.136.0 09.636.0 - 04.546.0 %   MCV 80.7 78.0 - 100.0 fL   MCH 26.0 26.0 - 34.0 pg   MCHC 32.2 30.0 - 36.0 g/dL   RDW 40.914.3 81.111.5 - 91.415.5 %   Platelets 293 150 - 400 K/uL  Comprehensive metabolic panel     Status: Abnormal   Collection Time: 11/12/17  5:18 PM  Result Value Ref Range   Sodium 139 135 - 145 mmol/L   Potassium 3.3 (L) 3.5 - 5.1 mmol/L   Chloride 104 101 - 111 mmol/L   CO2 26 22 - 32 mmol/L   Glucose, Bld 91 65 - 99 mg/dL   BUN 7 6 - 20 mg/dL   Creatinine, Ser 7.820.84 0.44 - 1.00 mg/dL   Calcium 8.5 (L) 8.9 - 10.3 mg/dL   Total Protein 7.1 6.5 - 8.1 g/dL   Albumin 3.2 (L) 3.5 - 5.0 g/dL   AST 12 (L) 15 - 41 U/L   ALT 13 (L) 14 - 54 U/L   Alkaline Phosphatase 117 38 - 126 U/L   Total Bilirubin 0.8 0.3 - 1.2 mg/dL   GFR calc non Af Amer >60 >60 mL/min   GFR calc Af Amer >60 >60 mL/min   Anion gap 9 5 - 15    MDM CBC, CMP, urine PCR for PP preeclampsia evaluation Tylenol 1 gm ordered Pt reports improvement in headache and  states is it "almost gone". No severe range BPs. Labs wnl. C/w Dr. Charlotta Newtonzan.  Will send home with rx for procardia & pt to f/u next week.   Assessment and Plan  A: 1. Postpartum hypertension    P; Discharge home Rx procardia xl 30 mg QD Call office to schedule f/u in 1 week Discussed reasons to return to MAU  Judeth Horn 11/12/2017, 4:59 PM

## 2017-11-12 NOTE — MAU Note (Signed)
Pt sent from office for elevated b/p and c/o headache. Vag delivery 11/03/2017.

## 2017-11-12 NOTE — Discharge Instructions (Signed)
Postpartum Hypertension °Postpartum hypertension is high blood pressure after pregnancy that remains higher than normal for more than two days after delivery. You may not realize that you have postpartum hypertension if your blood pressure is not being checked regularly. In some cases, postpartum hypertension will go away on its own, usually within a week of delivery. However, for some women, medical treatment is required to prevent serious complications, such as seizures or stroke. °The following things can affect your blood pressure: °· The type of delivery you had. °· Having received IV fluids or other medicines during or after delivery. ° °What are the causes? °Postpartum hypertension may be caused by any of the following or by a combination of any of the following: °· Hypertension that existed before pregnancy (chronic hypertension). °· Gestational hypertension. °· Preeclampsia or eclampsia. °· Receiving a lot of fluid through an IV during or after delivery. °· Medicines. °· HELLP syndrome. °· Hyperthyroidism. °· Stroke. °· Other rare neurological or blood disorders. ° °In some cases, the cause may not be known. °What increases the risk? °Postpartum hypertension can be related to one or more risk factors, such as: °· Chronic hypertension. In some cases, this may not have been diagnosed before pregnancy. °· Obesity. °· Type 2 diabetes. °· Kidney disease. °· Family history of preeclampsia. °· Other medical conditions that cause hormonal imbalances. ° °What are the signs or symptoms? °As with all types of hypertension, postpartum hypertension may not have any symptoms. Depending on how high your blood pressure is, you may experience: °· Headaches. These may be mild, moderate, or severe. They may also be steady, constant, or sudden in onset (thunderclap headache). °· Visual changes. °· Dizziness. °· Shortness of breath. °· Swelling of your hands, feet, lower legs, or face. In some cases, you may have swelling in  more than one of these locations. °· Heart palpitations or a racing heartbeat. °· Difficulty breathing while lying down. °· Decreased urination. ° °Other rare signs and symptoms may include: °· Sweating more than usual. This lasts longer than a few days after delivery. °· Chest pain. °· Sudden dizziness when you get up from sitting or lying down. °· Seizures. °· Nausea or vomiting. °· Abdominal pain. ° °How is this diagnosed? °The diagnosis of postpartum hypertension is made through a combination of physical examination findings and testing of your blood and urine. You may also have additional tests, such as a CT scan or an MRI, to check for other complications of postpartum hypertension. °How is this treated? °When blood pressure is high enough to require treatment, your options may include: °· Medicines to reduce blood pressure (antihypertensives). Tell your health care provider if you are breastfeeding or if you plan to breastfeed. There are many antihypertensive medicines that are safe to take while breastfeeding. °· Stopping medicines that may be causing hypertension. °· Treating medical conditions that are causing hypertension. °· Treating the complications of hypertension, such as seizures, stroke, or kidney problems. ° °Your health care provider will also continue to monitor your blood pressure closely and repeatedly until it is within a safe range for you. °Follow these instructions at home: °· Take medicines only as directed by your health care provider. °· Get regular exercise after your health care provider tells you that it is safe. °· Follow your health care provider’s recommendations on fluid and salt restrictions. °· Do not use any tobacco products, including cigarettes, chewing tobacco, or electronic cigarettes. If you need help quitting, ask your health care provider. °·   Keep all follow-up visits as directed by your health care provider. This is important. °Contact a health care provider  if: °· Your symptoms get worse. °· You have new symptoms, such as: °? Headache. °? Dizziness. °? Visual changes. °Get help right away if: °· You develop a severe or sudden headache. °· You have seizures. °· You develop numbness or weakness on one side of your body. °· You have difficulty thinking, speaking, or swallowing. °· You develop severe abdominal pain. °· You develop difficulty breathing, chest pain, a racing heartbeat, or heart palpitations. °These symptoms may represent a serious problem that is an emergency. Do not wait to see if the symptoms will go away. Get medical help right away. Call your local emergency services (911 in the [place] Do not drive yourself to the hospital. °This information is not intended to replace advice given to you by your health care provider. Make sure you discuss any questions you have with your health care provider. °Document Released: 05/05/2014 Document Revised: 02/04/2016 Document Reviewed: 03/16/2014 °[employer]vier Interactive Patient Education © 2018 [employer] ° °

## 2022-08-06 ENCOUNTER — Ambulatory Visit
Admission: EM | Admit: 2022-08-06 | Discharge: 2022-08-06 | Disposition: A | Discharge status: Home / Self Care | Payer: 59 | Attending: Internal Medicine | Admitting: Internal Medicine

## 2022-08-06 DIAGNOSIS — J069 Acute upper respiratory infection, unspecified: Secondary | ICD-10-CM | POA: Diagnosis present

## 2022-08-06 DIAGNOSIS — J029 Acute pharyngitis, unspecified: Secondary | ICD-10-CM

## 2022-08-06 LAB — POCT RAPID STREP A (OFFICE): Rapid Strep A Screen: NEGATIVE

## 2022-08-06 MED ORDER — BENZONATATE 100 MG PO CAPS: 100.0000 mg | ORAL_CAPSULE | Freq: Three times a day (TID) | ORAL | 0 refills | Status: AC | PRN

## 2022-08-06 MED ORDER — CHLORASEPTIC 1.4 % MT LIQD: 1.0000 | OROMUCOSAL | 0 refills | Status: AC | PRN

## 2022-08-06 MED ORDER — CETIRIZINE HCL 10 MG PO TABS: 10.0000 mg | ORAL_TABLET | Freq: Every day | ORAL | 0 refills | Status: AC

## 2022-08-06 MED ORDER — FLUTICASONE PROPIONATE 50 MCG/ACT NA SUSP: 1.0000 | Freq: Every day | NASAL | 0 refills | Status: AC

## 2022-08-06 NOTE — ED Triage Notes (Signed)
Pt c/o being hot and cold, cough, nasal drainage, sore throat onset ~ 2 days ago

## 2022-08-06 NOTE — Discharge Instructions (Signed)
Your rapid strep test was negative.  Throat culture is pending.  Will call if it is positive.  It appears that you have a viral upper respiratory infection that should run its course and self resolve with symptomatic treatment.  I have  prescribed you four medications to help alleviate symptoms.  Please follow-up if symptoms persist or worsen.

## 2022-08-06 NOTE — ED Provider Notes (Signed)
[facility]    CSN: [id number] Arrival date & time: 08/06/22  1359      History   Chief Complaint Chief Complaint  Patient presents with   Sore Throat    HPI Virginia Howard is a 31 y.o. female.   Patient presents with sore throat, nasal drainage, cough that started about 2 days ago.  She denies any known sick contacts or fever.  Denies chest pain, shortness of breath, ear pain, nausea, vomiting, diarrhea, abdominal pain.  Patient has taken Tylenol with minimal improvement of symptoms.   Sore Throat    History reviewed. No pertinent past medical history.  Patient Active Problem List   Diagnosis Date Noted   Normal labor 11/03/2017    History reviewed. No pertinent surgical history.  OB History     Gravida  1   Para  1   Term  1   Preterm  0   AB      Living  1      SAB  0   IAB  0   Ectopic  0   Multiple  0   Live Births  1            Home Medications    Prior to Admission medications   Medication Sig Start Date End Date Taking? Authorizing Provider  benzonatate (TESSALON) 100 MG capsule Take 1 capsule (100 mg total) by mouth every 8 (eight) hours as needed for cough. 08/06/22  Yes Texie Tupou, Acie Fredrickson, FNP  cetirizine (ZYRTEC) 10 MG tablet Take 1 tablet (10 mg total) by mouth daily. 08/06/22  Yes Yannet Rincon, Rolly Salter E, FNP  fluticasone (FLONASE) 50 MCG/ACT nasal spray Place 1 spray into both nostrils daily. 08/06/22  Yes Saifan Rayford, Rolly Salter E, FNP  phenol (CHLORASEPTIC) 1.4 % LIQD Use as directed 1 spray in the mouth or throat as needed for throat irritation / pain. 08/06/22  Yes Yides Saidi, Rolly Salter E, FNP  acetaminophen (TYLENOL) 500 MG tablet Take 500 mg by mouth every 6 (six) hours as needed for mild pain or headache.    [provider]  ibuprofen (ADVIL,MOTRIN) 800 MG tablet 1 po every 8 hours as needed 11/05/17   Gerald Leitz, MD  NIFEdipine (PROCARDIA-XL/ADALAT-CC/NIFEDICAL-XL) 30 MG 24 hr tablet Take 1 tablet (30 mg total) by mouth daily.  11/12/17   Judeth Horn, NP  Prenatal Vit-Fe Fumarate-FA (PRENATAL MULTIVITAMIN) TABS tablet Take 1 tablet by mouth daily at 12 noon.    [provider]  sertraline (ZOLOFT) 50 MG tablet Take 50 mg by mouth daily.  10/27/17   [provider]    Family History Family History  Problem Relation Age of Onset   Hypertension Father    Diabetes Maternal Grandmother    Hypertension Maternal Grandmother    Diabetes Paternal Grandmother     Social History Social History   Tobacco Use   Smoking status: Former    Types: Cigarettes    Quit date: 05/03/2017    Years since quitting: 5.2   Smokeless tobacco: Never  Substance Use Topics   Alcohol use: Yes    Comment: socially   Drug use: No     Allergies   Patient has no known allergies.   Review of Systems Review of Systems Per HPI  Physical Exam Triage Vital Signs ED Triage Vitals  Enc Vitals Group     BP 08/06/22 1456 123/83     Pulse Rate 08/06/22 1456 89     Resp 08/06/22 1456 16  Temp 08/06/22 1456 98.9 F (37.2 C)     Temp Source 08/06/22 1456 Oral     SpO2 08/06/22 1456 97 %     Weight --      Height --      Head Circumference --      Peak Flow --      Pain Score 08/06/22 1457 7     Pain Loc --      Pain Edu? --      Excl. in GC? --    No data found.  Updated Vital Signs BP 123/83 (BP Location: Left Arm)   Pulse 89   Temp 98.9 F (37.2 C) (Oral)   Resp 16   SpO2 97%   Breastfeeding No   Visual Acuity Right Eye Distance:   Left Eye Distance:   Bilateral Distance:    Right Eye Near:   Left Eye Near:    Bilateral Near:     Physical Exam Constitutional:      General: She is not in acute distress.    Appearance: Normal appearance. She is not toxic-appearing or diaphoretic.  HENT:     Head: Normocephalic and atraumatic.     Right Ear: Ear canal normal. No drainage, swelling or tenderness. A middle ear effusion is present. Tympanic membrane is not perforated, erythematous or  bulging.     Left Ear: Ear canal normal. No drainage, swelling or tenderness. A middle ear effusion is present. Tympanic membrane is not perforated or bulging.     Nose: Congestion present.     Mouth/Throat:     Mouth: Mucous membranes are moist.     Pharynx: Posterior oropharyngeal erythema present. No pharyngeal swelling, oropharyngeal exudate or uvula swelling.     Tonsils: No tonsillar exudate or tonsillar abscesses.  Eyes:     Extraocular Movements: Extraocular movements intact.     Conjunctiva/sclera: Conjunctivae normal.     Pupils: Pupils are equal, round, and reactive to light.  Cardiovascular:     Rate and Rhythm: Normal rate and regular rhythm.     Pulses: Normal pulses.     Heart sounds: Normal heart sounds.  Pulmonary:     Effort: Pulmonary effort is normal. No respiratory distress.     Breath sounds: Normal breath sounds. No stridor. No wheezing, rhonchi or rales.  Abdominal:     General: Abdomen is flat. Bowel sounds are normal.     Palpations: Abdomen is soft.  Musculoskeletal:        General: Normal range of motion.     Cervical back: Normal range of motion.  Skin:    General: Skin is warm and dry.  Neurological:     General: No focal deficit present.     Mental Status: She is alert and oriented to person, place, and time. Mental status is at baseline.  Psychiatric:        Mood and Affect: Mood normal.        Behavior: Behavior normal.      UC Treatments / Results  Labs (all labs ordered are listed, but only abnormal results are displayed) Labs Reviewed  CULTURE, GROUP A STREP [facility])  POCT RAPID STREP A (OFFICE)    EKG   Radiology No results found.  Procedures Procedures (including critical care time)  Medications Ordered in UC Medications - No data to display  Initial Impression / Assessment and Plan / UC Course  I have reviewed the triage vital signs and the nursing notes.  Pertinent labs &  imaging results that were available during my  care of the patient were reviewed by me and considered in my medical decision making (see chart for details).     Patient presents with symptoms likely from a viral upper respiratory infection. Differential includes bacterial pneumonia, sinusitis, allergic rhinitis, COVID-19, flu, RSV. Do not suspect underlying cardiopulmonary process. Symptoms seem unlikely related to ACS, CHF or COPD exacerbations, pneumonia, pneumothorax. Patient is nontoxic appearing and not in need of emergent medical intervention.  Rapid strep is negative.  Throat culture pending.  Patient declined COVID testing given that she had a negative COVID test at home.  Recommended symptom control with over the counter medications.  Patient sent prescriptions.  Patient states that she does not take any daily medications so these should be safe.  Return if symptoms fail to improve in 1-2 weeks or you develop shortness of breath, chest pain, severe headache. Patient states understanding and is agreeable.  Discharged with PCP followup.  Final Clinical Impressions(s) / UC Diagnoses   Final diagnoses:  Viral upper respiratory tract infection with cough  Sore throat     Discharge Instructions      Your rapid strep test was negative.  Throat culture is pending.  Will call if it is positive.  It appears that you have a viral upper respiratory infection that should run its course and self resolve with symptomatic treatment.  I have  prescribed you four medications to help alleviate symptoms.  Please follow-up if symptoms persist or worsen.    ED Prescriptions     Medication Sig Dispense Auth. Provider   fluticasone (FLONASE) 50 MCG/ACT nasal spray Place 1 spray into both nostrils daily. 16 g Ervin Knack E, [place]   cetirizine (ZYRTEC) 10 MG tablet Take 1 tablet (10 mg total) by mouth daily. 30 tablet [place], [place] E, [place]   phenol (CHLORASEPTIC) 1.4 % LIQD Use as directed 1 spray in the mouth or throat as needed for throat irritation  / pain. 118 mL [place]ord Sedler, [place] E, [place]   benzonatate (TESSALON) 100 MG capsule Take 1 capsule (100 mg total) by mouth every 8 (eight) hours as needed for cough. 21 capsule [place], Acie Fredrickson, [place]      PDMP not reviewed this encounter.   Gustavus Bryant, [place] 08/06/22 [phone]

## 2022-08-08 LAB — CULTURE, GROUP A STREP (THRC)

## 2023-05-05 ENCOUNTER — Other Ambulatory Visit (HOSPITAL_BASED_OUTPATIENT_CLINIC_OR_DEPARTMENT_OTHER): Payer: Self-pay

## 2023-05-05 MED ORDER — WEGOVY 0.5 MG/0.5ML ~~LOC~~ SOAJ
0.5000 mg | SUBCUTANEOUS | 0 refills | Status: AC
  Filled 2023-05-05: qty 2, 28d supply, fill #0

## 2023-05-27 ENCOUNTER — Other Ambulatory Visit (HOSPITAL_BASED_OUTPATIENT_CLINIC_OR_DEPARTMENT_OTHER): Payer: Self-pay

## 2023-05-27 MED ORDER — WEGOVY 1 MG/0.5ML ~~LOC~~ SOAJ
1.0000 mg | SUBCUTANEOUS | 0 refills | Status: AC
  Filled 2023-05-27 – 2023-05-28 (×2): qty 2, 28d supply, fill #0

## 2023-05-28 ENCOUNTER — Other Ambulatory Visit (HOSPITAL_BASED_OUTPATIENT_CLINIC_OR_DEPARTMENT_OTHER): Payer: Self-pay

## 2023-06-19 ENCOUNTER — Other Ambulatory Visit (HOSPITAL_BASED_OUTPATIENT_CLINIC_OR_DEPARTMENT_OTHER): Payer: Self-pay

## 2023-06-19 MED ORDER — WEGOVY 1.7 MG/0.75ML ~~LOC~~ SOAJ
1.7000 mg | SUBCUTANEOUS | 0 refills | Status: AC
  Filled 2023-06-19 – 2023-07-14 (×2): qty 3, 28d supply, fill #0

## 2023-06-30 ENCOUNTER — Other Ambulatory Visit (HOSPITAL_BASED_OUTPATIENT_CLINIC_OR_DEPARTMENT_OTHER): Payer: Self-pay

## 2023-07-14 ENCOUNTER — Other Ambulatory Visit (HOSPITAL_BASED_OUTPATIENT_CLINIC_OR_DEPARTMENT_OTHER): Payer: Self-pay

## 2023-07-15 ENCOUNTER — Other Ambulatory Visit (HOSPITAL_BASED_OUTPATIENT_CLINIC_OR_DEPARTMENT_OTHER): Payer: Self-pay

## 2023-07-15 MED ORDER — WEGOVY 2.4 MG/0.75ML ~~LOC~~ SOAJ
2.4000 mg | SUBCUTANEOUS | 2 refills | Status: AC
  Filled 2023-07-15 – 2023-08-18 (×2): qty 3, 28d supply, fill #0
  Filled 2023-09-14: qty 3, 28d supply, fill #1
  Filled 2023-10-13: qty 3, 28d supply, fill #2

## 2023-07-16 ENCOUNTER — Other Ambulatory Visit (HOSPITAL_BASED_OUTPATIENT_CLINIC_OR_DEPARTMENT_OTHER): Payer: Self-pay

## 2023-08-18 ENCOUNTER — Other Ambulatory Visit (HOSPITAL_BASED_OUTPATIENT_CLINIC_OR_DEPARTMENT_OTHER): Payer: Self-pay

## 2023-10-14 ENCOUNTER — Other Ambulatory Visit (HOSPITAL_BASED_OUTPATIENT_CLINIC_OR_DEPARTMENT_OTHER): Payer: Self-pay

## 2023-10-15 ENCOUNTER — Other Ambulatory Visit (HOSPITAL_COMMUNITY): Payer: Self-pay

## 2023-10-15 ENCOUNTER — Other Ambulatory Visit: Payer: Self-pay

## 2023-10-15 ENCOUNTER — Other Ambulatory Visit (HOSPITAL_BASED_OUTPATIENT_CLINIC_OR_DEPARTMENT_OTHER): Payer: Self-pay

## 2023-10-15 MED ORDER — PHENTERMINE HCL 37.5 MG PO TABS
37.5000 mg | ORAL_TABLET | Freq: Every day | ORAL | 0 refills | Status: AC
  Filled 2023-10-15: qty 30, 30d supply, fill #0

## 2023-10-17 ENCOUNTER — Other Ambulatory Visit (HOSPITAL_BASED_OUTPATIENT_CLINIC_OR_DEPARTMENT_OTHER): Payer: Self-pay
